# Patient Record
Sex: Male | Born: 1935 | Race: White | Hispanic: No | Marital: Married | Smoking: Former smoker
Health system: Southern US, Community
[De-identification: ages and names within clinical notes are randomized; demographics above are authoritative.]

## PROBLEM LIST (undated history)

## (undated) DIAGNOSIS — R471 Dysarthria and anarthria: Secondary | ICD-10-CM

## (undated) DIAGNOSIS — C801 Malignant (primary) neoplasm, unspecified: Secondary | ICD-10-CM

## (undated) DIAGNOSIS — Z8673 Personal history of transient ischemic attack (TIA), and cerebral infarction without residual deficits: Secondary | ICD-10-CM

## (undated) DIAGNOSIS — R251 Tremor, unspecified: Secondary | ICD-10-CM

## (undated) DIAGNOSIS — M1990 Unspecified osteoarthritis, unspecified site: Secondary | ICD-10-CM

## (undated) DIAGNOSIS — E039 Hypothyroidism, unspecified: Secondary | ICD-10-CM

## (undated) DIAGNOSIS — L409 Psoriasis, unspecified: Secondary | ICD-10-CM

## (undated) DIAGNOSIS — Z85828 Personal history of other malignant neoplasm of skin: Secondary | ICD-10-CM

## (undated) DIAGNOSIS — N390 Urinary tract infection, site not specified: Secondary | ICD-10-CM

## (undated) DIAGNOSIS — I10 Essential (primary) hypertension: Secondary | ICD-10-CM

## (undated) HISTORY — DX: Tremor, unspecified: R25.1

## (undated) HISTORY — PX: COLECTOMY: SHX59

## (undated) HISTORY — DX: Malignant (primary) neoplasm, unspecified: C80.1

## (undated) HISTORY — DX: Dysarthria and anarthria: R47.1

## (undated) HISTORY — DX: Psoriasis, unspecified: L40.9

## (undated) HISTORY — DX: Urinary tract infection, site not specified: N39.0

## (undated) HISTORY — DX: Unspecified osteoarthritis, unspecified site: M19.90

## (undated) HISTORY — DX: Essential (primary) hypertension: I10

## (undated) HISTORY — DX: Hypothyroidism, unspecified: E03.9

## (undated) HISTORY — DX: Personal history of other malignant neoplasm of skin: Z85.828

## (undated) HISTORY — DX: Personal history of transient ischemic attack (TIA), and cerebral infarction without residual deficits: Z86.73

---

## 2004-04-16 ENCOUNTER — Inpatient Hospital Stay (HOSPITAL_COMMUNITY): Admission: AD | Admit: 2004-04-16 | Discharge: 2004-04-20 | Payer: Self-pay | Admitting: Family Medicine

## 2004-04-19 ENCOUNTER — Encounter (INDEPENDENT_AMBULATORY_CARE_PROVIDER_SITE_OTHER): Payer: Self-pay | Admitting: *Deleted

## 2004-04-22 ENCOUNTER — Encounter: Admission: RE | Admit: 2004-04-22 | Discharge: 2004-04-22 | Payer: Self-pay | Admitting: Family Medicine

## 2004-07-16 ENCOUNTER — Inpatient Hospital Stay (HOSPITAL_COMMUNITY): Admission: RE | Admit: 2004-07-16 | Discharge: 2004-07-22 | Payer: Self-pay | Admitting: General Surgery

## 2004-07-16 ENCOUNTER — Encounter (INDEPENDENT_AMBULATORY_CARE_PROVIDER_SITE_OTHER): Payer: Self-pay | Admitting: *Deleted

## 2004-08-16 ENCOUNTER — Ambulatory Visit: Admission: RE | Admit: 2004-08-16 | Discharge: 2004-10-02 | Payer: Self-pay | Admitting: Radiation Oncology

## 2004-08-16 ENCOUNTER — Ambulatory Visit (HOSPITAL_COMMUNITY): Admission: RE | Admit: 2004-08-16 | Discharge: 2004-08-16 | Payer: Self-pay | Admitting: Hematology and Oncology

## 2004-10-04 ENCOUNTER — Ambulatory Visit: Payer: Self-pay | Admitting: Hematology and Oncology

## 2004-12-06 ENCOUNTER — Ambulatory Visit: Payer: Self-pay | Admitting: Hematology and Oncology

## 2005-02-21 ENCOUNTER — Ambulatory Visit (HOSPITAL_COMMUNITY): Admission: RE | Admit: 2005-02-21 | Discharge: 2005-02-21 | Payer: Self-pay | Admitting: Hematology and Oncology

## 2005-02-26 ENCOUNTER — Ambulatory Visit: Payer: Self-pay | Admitting: Hematology and Oncology

## 2005-06-10 ENCOUNTER — Ambulatory Visit: Payer: Self-pay | Admitting: Gastroenterology

## 2005-06-24 ENCOUNTER — Ambulatory Visit: Payer: Self-pay | Admitting: Internal Medicine

## 2005-06-25 ENCOUNTER — Ambulatory Visit: Payer: Self-pay | Admitting: Gastroenterology

## 2005-07-01 ENCOUNTER — Ambulatory Visit: Payer: Self-pay | Admitting: Internal Medicine

## 2005-07-01 ENCOUNTER — Ambulatory Visit: Payer: Self-pay | Admitting: Hematology and Oncology

## 2005-10-29 ENCOUNTER — Ambulatory Visit: Payer: Self-pay | Admitting: Hematology and Oncology

## 2006-01-01 ENCOUNTER — Ambulatory Visit: Payer: Self-pay | Admitting: Internal Medicine

## 2006-03-13 ENCOUNTER — Ambulatory Visit: Payer: Self-pay | Admitting: Hematology and Oncology

## 2006-03-13 LAB — COMPREHENSIVE METABOLIC PANEL
ALT: 14 U/L (ref 0–40)
Albumin: 4.5 g/dL (ref 3.5–5.2)
CO2: 30 mEq/L (ref 19–32)
Calcium: 9.4 mg/dL (ref 8.4–10.5)
Chloride: 102 mEq/L (ref 96–112)
Potassium: 4.3 mEq/L (ref 3.5–5.3)
Sodium: 140 mEq/L (ref 135–145)
Total Protein: 7.1 g/dL (ref 6.0–8.3)

## 2006-03-13 LAB — CBC WITH DIFFERENTIAL/PLATELET
Basophils Absolute: 0 10*3/uL (ref 0.0–0.1)
HCT: 41.3 % (ref 38.7–49.9)
HGB: 14.2 g/dL (ref 13.0–17.1)
LYMPH%: 24.5 % (ref 14.0–48.0)
MCH: 31.9 pg (ref 28.0–33.4)
MONO#: 0.8 10*3/uL (ref 0.1–0.9)
NEUT%: 63.3 % (ref 40.0–75.0)
Platelets: 319 10*3/uL (ref 145–400)
WBC: 8 10*3/uL (ref 4.0–10.0)
lymph#: 2 10*3/uL (ref 0.9–3.3)

## 2006-03-13 LAB — CEA: CEA: 1.4 ng/mL (ref 0.0–5.0)

## 2006-03-16 ENCOUNTER — Encounter: Admission: RE | Admit: 2006-03-16 | Discharge: 2006-03-16 | Payer: Self-pay | Admitting: Hematology and Oncology

## 2006-07-23 ENCOUNTER — Ambulatory Visit: Payer: Self-pay | Admitting: Internal Medicine

## 2006-07-28 ENCOUNTER — Ambulatory Visit: Payer: Self-pay | Admitting: Internal Medicine

## 2006-07-29 ENCOUNTER — Ambulatory Visit: Payer: Self-pay | Admitting: Hematology and Oncology

## 2006-07-30 LAB — CBC WITH DIFFERENTIAL/PLATELET
BASO%: 0.3 % (ref 0.0–2.0)
EOS%: 0.9 % (ref 0.0–7.0)
HCT: 41.3 % (ref 38.7–49.9)
LYMPH%: 19.1 % (ref 14.0–48.0)
MCH: 32 pg (ref 28.0–33.4)
MCHC: 34 g/dL (ref 32.0–35.9)
MCV: 94 fL (ref 81.6–98.0)
MONO#: 1.4 10*3/uL — ABNORMAL HIGH (ref 0.1–0.9)
MONO%: 15.1 % — ABNORMAL HIGH (ref 0.0–13.0)
NEUT%: 64.6 % (ref 40.0–75.0)
Platelets: 347 10*3/uL (ref 145–400)
RBC: 4.39 10*6/uL (ref 4.20–5.71)
WBC: 9.2 10*3/uL (ref 4.0–10.0)

## 2006-07-30 LAB — COMPREHENSIVE METABOLIC PANEL
ALT: 12 U/L (ref 0–40)
Alkaline Phosphatase: 55 U/L (ref 39–117)
CO2: 29 mEq/L (ref 19–32)
Creatinine, Ser: 0.97 mg/dL (ref 0.40–1.50)
Sodium: 142 mEq/L (ref 135–145)
Total Bilirubin: 0.5 mg/dL (ref 0.3–1.2)
Total Protein: 6.9 g/dL (ref 6.0–8.3)

## 2006-07-30 LAB — CEA: CEA: 1.1 ng/mL (ref 0.0–5.0)

## 2006-10-02 ENCOUNTER — Ambulatory Visit: Payer: Self-pay | Admitting: Internal Medicine

## 2007-01-01 ENCOUNTER — Ambulatory Visit: Payer: Self-pay | Admitting: Internal Medicine

## 2007-01-15 ENCOUNTER — Ambulatory Visit: Payer: Self-pay | Admitting: Internal Medicine

## 2007-01-26 ENCOUNTER — Ambulatory Visit: Payer: Self-pay | Admitting: Hematology and Oncology

## 2007-01-29 LAB — CBC WITH DIFFERENTIAL/PLATELET
BASO%: 0.3 % (ref 0.0–2.0)
EOS%: 1.5 % (ref 0.0–7.0)
MCH: 31.8 pg (ref 28.0–33.4)
MCHC: 34.7 g/dL (ref 32.0–35.9)
MONO%: 9.7 % (ref 0.0–13.0)
RDW: 13.2 % (ref 11.2–14.6)
lymph#: 2.1 10*3/uL (ref 0.9–3.3)

## 2007-01-29 LAB — COMPREHENSIVE METABOLIC PANEL
ALT: 12 U/L (ref 0–53)
AST: 17 U/L (ref 0–37)
Albumin: 4.7 g/dL (ref 3.5–5.2)
Alkaline Phosphatase: 56 U/L (ref 39–117)
Calcium: 9.5 mg/dL (ref 8.4–10.5)
Chloride: 100 mEq/L (ref 96–112)
Creatinine, Ser: 1.09 mg/dL (ref 0.40–1.50)
Potassium: 4.4 mEq/L (ref 3.5–5.3)

## 2007-02-02 ENCOUNTER — Encounter: Admission: RE | Admit: 2007-02-02 | Discharge: 2007-02-02 | Payer: Self-pay | Admitting: Hematology and Oncology

## 2007-05-20 ENCOUNTER — Ambulatory Visit: Payer: Self-pay | Admitting: Internal Medicine

## 2007-05-20 LAB — CONVERTED CEMR LAB
ALT: 16 units/L (ref 0–53)
Albumin: 3.9 g/dL (ref 3.5–5.2)
Alkaline Phosphatase: 53 units/L (ref 39–117)
BUN: 27 mg/dL — ABNORMAL HIGH (ref 6–23)
Basophils Absolute: 0.1 10*3/uL (ref 0.0–0.1)
Bilirubin, Direct: 0.1 mg/dL (ref 0.0–0.3)
Calcium: 9.5 mg/dL (ref 8.4–10.5)
Eosinophils Absolute: 0.2 10*3/uL (ref 0.0–0.6)
GFR calc Af Amer: 92 mL/min
GFR calc non Af Amer: 76 mL/min
HDL: 46 mg/dL (ref >39.0)
Lymphocytes Relative: 25.8 % (ref 12.0–46.0)
MCV: 93.8 fL (ref 78.0–100.0)
Monocytes Relative: 11.9 % — ABNORMAL HIGH (ref 3.0–11.0)
Neutro Abs: 6.1 10*3/uL (ref 1.4–7.7)
Platelets: 299 10*3/uL (ref 150–400)
Total CHOL/HDL Ratio: 2.9
Triglycerides: 114 mg/dL (ref 0–149)

## 2007-05-27 ENCOUNTER — Ambulatory Visit: Payer: Self-pay | Admitting: Internal Medicine

## 2007-08-02 ENCOUNTER — Ambulatory Visit: Payer: Self-pay | Admitting: Hematology and Oncology

## 2007-08-04 LAB — COMPREHENSIVE METABOLIC PANEL
AST: 15 U/L (ref 0–37)
Albumin: 4.4 g/dL (ref 3.5–5.2)
Alkaline Phosphatase: 46 U/L (ref 39–117)
Potassium: 4 mEq/L (ref 3.5–5.3)
Sodium: 141 mEq/L (ref 135–145)
Total Protein: 7.2 g/dL (ref 6.0–8.3)

## 2007-08-04 LAB — CBC WITH DIFFERENTIAL/PLATELET
Basophils Absolute: 0 10*3/uL (ref 0.0–0.1)
EOS%: 0.9 % (ref 0.0–7.0)
Eosinophils Absolute: 0.1 10*3/uL (ref 0.0–0.5)
HGB: 14.3 g/dL (ref 13.0–17.1)
LYMPH%: 20.8 % (ref 14.0–48.0)
MCH: 32.8 pg (ref 28.0–33.4)
MCV: 92.4 fL (ref 81.6–98.0)
MONO%: 10.4 % (ref 0.0–13.0)
NEUT%: 67.6 % (ref 40.0–75.0)
Platelets: 311 10*3/uL (ref 145–400)
RDW: 13.5 % (ref 11.2–14.6)

## 2007-11-25 DIAGNOSIS — Z85828 Personal history of other malignant neoplasm of skin: Secondary | ICD-10-CM

## 2007-11-25 HISTORY — DX: Personal history of other malignant neoplasm of skin: Z85.828

## 2007-11-29 ENCOUNTER — Ambulatory Visit: Payer: Self-pay | Admitting: Internal Medicine

## 2007-11-29 DIAGNOSIS — E7800 Pure hypercholesterolemia, unspecified: Secondary | ICD-10-CM | POA: Insufficient documentation

## 2007-12-01 LAB — CONVERTED CEMR LAB
Albumin: 3.7 g/dL (ref 3.5–5.2)
Alkaline Phosphatase: 46 units/L (ref 39–117)
BUN: 25 mg/dL — ABNORMAL HIGH (ref 6–23)
Basophils Relative: 0.9 % (ref 0.0–1.0)
Crystals: NEGATIVE
Eosinophils Absolute: 0.2 10*3/uL (ref 0.0–0.6)
GFR calc Af Amer: 75 mL/min
HDL: 41.8 mg/dL (ref >39.0)
Ketones, ur: NEGATIVE mg/dL
LDL Cholesterol: 60 mg/dL (ref 0–99)
Lymphocytes Relative: 18.6 % (ref 12.0–46.0)
Monocytes Relative: 16.6 % — ABNORMAL HIGH (ref 3.0–11.0)
Neutro Abs: 5.6 10*3/uL (ref 1.4–7.7)
PSA: 1.75 ng/mL (ref 0.10–4.00)
Platelets: 280 10*3/uL (ref 150–400)
Potassium: 4.4 meq/L (ref 3.5–5.1)
Specific Gravity, Urine: 1.01 (ref 1.000–1.03)
TSH: 1.43 microintl units/mL (ref 0.35–5.50)
Triglycerides: 146 mg/dL (ref 0–149)
Urine Glucose: NEGATIVE mg/dL
Urobilinogen, UA: 0.2 (ref 0.0–1.0)
VLDL: 29 mg/dL (ref 0–40)
pH: 7.5 (ref 5.0–8.0)

## 2007-12-03 ENCOUNTER — Ambulatory Visit: Payer: Self-pay | Admitting: Internal Medicine

## 2007-12-03 DIAGNOSIS — D485 Neoplasm of uncertain behavior of skin: Secondary | ICD-10-CM

## 2007-12-03 DIAGNOSIS — Z85038 Personal history of other malignant neoplasm of large intestine: Secondary | ICD-10-CM | POA: Insufficient documentation

## 2007-12-03 DIAGNOSIS — E039 Hypothyroidism, unspecified: Secondary | ICD-10-CM | POA: Insufficient documentation

## 2007-12-03 DIAGNOSIS — M1990 Unspecified osteoarthritis, unspecified site: Secondary | ICD-10-CM | POA: Insufficient documentation

## 2007-12-03 DIAGNOSIS — N419 Inflammatory disease of prostate, unspecified: Secondary | ICD-10-CM | POA: Insufficient documentation

## 2007-12-03 DIAGNOSIS — I10 Essential (primary) hypertension: Secondary | ICD-10-CM | POA: Insufficient documentation

## 2007-12-03 LAB — CONVERTED CEMR LAB
Bilirubin Urine: NEGATIVE
Ketones, ur: NEGATIVE mg/dL
Nitrite: NEGATIVE
Specific Gravity, Urine: 1.02 (ref 1.000–1.03)
pH: 6 (ref 5.0–8.0)

## 2007-12-06 ENCOUNTER — Encounter: Payer: Self-pay | Admitting: Internal Medicine

## 2007-12-07 ENCOUNTER — Telehealth: Payer: Self-pay | Admitting: Internal Medicine

## 2007-12-23 ENCOUNTER — Ambulatory Visit: Payer: Self-pay | Admitting: Internal Medicine

## 2008-01-12 ENCOUNTER — Encounter: Payer: Self-pay | Admitting: Internal Medicine

## 2008-01-13 ENCOUNTER — Encounter (INDEPENDENT_AMBULATORY_CARE_PROVIDER_SITE_OTHER): Payer: Self-pay | Admitting: *Deleted

## 2008-01-25 ENCOUNTER — Ambulatory Visit: Payer: Self-pay | Admitting: Hematology and Oncology

## 2008-01-27 LAB — CBC WITH DIFFERENTIAL/PLATELET
Eosinophils Absolute: 0.1 10*3/uL (ref 0.0–0.5)
HCT: 40.9 % (ref 38.7–49.9)
HGB: 14.5 g/dL (ref 13.0–17.1)
LYMPH%: 24.6 % (ref 14.0–48.0)
MONO#: 1 10*3/uL — ABNORMAL HIGH (ref 0.1–0.9)
NEUT#: 5.8 10*3/uL (ref 1.5–6.5)
Platelets: 322 10*3/uL (ref 145–400)
RBC: 4.44 10*6/uL (ref 4.20–5.71)
WBC: 9.1 10*3/uL (ref 4.0–10.0)

## 2008-01-27 LAB — COMPREHENSIVE METABOLIC PANEL
Albumin: 4.5 g/dL (ref 3.5–5.2)
CO2: 29 mEq/L (ref 19–32)
Glucose, Bld: 98 mg/dL (ref 70–99)
Sodium: 143 mEq/L (ref 135–145)
Total Bilirubin: 0.6 mg/dL (ref 0.3–1.2)
Total Protein: 7 g/dL (ref 6.0–8.3)

## 2008-01-28 ENCOUNTER — Encounter: Admission: RE | Admit: 2008-01-28 | Discharge: 2008-01-28 | Payer: Self-pay | Admitting: Hematology and Oncology

## 2008-01-28 ENCOUNTER — Encounter: Payer: Self-pay | Admitting: Internal Medicine

## 2008-02-08 ENCOUNTER — Encounter: Payer: Self-pay | Admitting: Internal Medicine

## 2008-02-10 ENCOUNTER — Encounter: Payer: Self-pay | Admitting: Internal Medicine

## 2008-03-07 ENCOUNTER — Ambulatory Visit: Payer: Self-pay | Admitting: Internal Medicine

## 2008-03-07 LAB — CONVERTED CEMR LAB
Bilirubin Urine: NEGATIVE
Ketones, ur: NEGATIVE mg/dL
Leukocytes, UA: NEGATIVE
Specific Gravity, Urine: 1.015 (ref 1.000–1.03)
Urobilinogen, UA: 0.2 (ref 0.0–1.0)

## 2008-03-13 ENCOUNTER — Ambulatory Visit: Payer: Self-pay | Admitting: Internal Medicine

## 2008-03-13 DIAGNOSIS — Z8679 Personal history of other diseases of the circulatory system: Secondary | ICD-10-CM | POA: Insufficient documentation

## 2008-03-13 DIAGNOSIS — Z85828 Personal history of other malignant neoplasm of skin: Secondary | ICD-10-CM

## 2008-03-13 DIAGNOSIS — R21 Rash and other nonspecific skin eruption: Secondary | ICD-10-CM

## 2008-05-01 ENCOUNTER — Encounter: Payer: Self-pay | Admitting: Internal Medicine

## 2008-06-20 ENCOUNTER — Telehealth: Payer: Self-pay | Admitting: Internal Medicine

## 2008-07-05 ENCOUNTER — Telehealth: Payer: Self-pay | Admitting: Internal Medicine

## 2008-07-17 ENCOUNTER — Ambulatory Visit: Payer: Self-pay | Admitting: Internal Medicine

## 2008-07-19 ENCOUNTER — Ambulatory Visit: Payer: Self-pay | Admitting: Internal Medicine

## 2008-07-19 LAB — CONVERTED CEMR LAB
Basophils Absolute: 0.1 10*3/uL (ref 0.0–0.1)
CO2: 32 meq/L (ref 19–32)
Chloride: 106 meq/L (ref 96–112)
Eosinophils Absolute: 0.2 10*3/uL (ref 0.0–0.7)
GFR calc non Af Amer: 56 mL/min
Lymphocytes Relative: 23.1 % (ref 12.0–46.0)
MCHC: 35 g/dL (ref 30.0–36.0)
MCV: 94.4 fL (ref 78.0–100.0)
Neutrophils Relative %: 63.6 % (ref 43.0–77.0)
Platelets: 335 10*3/uL (ref 150–400)
Potassium: 3.7 meq/L (ref 3.5–5.1)
RBC: 4.38 M/uL (ref 4.22–5.81)
RDW: 12.3 % (ref 11.5–14.6)
Sodium: 142 meq/L (ref 135–145)
TSH: 1.46 microintl units/mL (ref 0.35–5.50)

## 2008-07-21 ENCOUNTER — Encounter: Payer: Self-pay | Admitting: Internal Medicine

## 2008-07-21 LAB — CONVERTED CEMR LAB
Crystals: NEGATIVE
Ketones, ur: NEGATIVE mg/dL
Mucus, UA: NEGATIVE
Nitrite: NEGATIVE
Specific Gravity, Urine: 1.01 (ref 1.000–1.03)
Urobilinogen, UA: 1 (ref 0.0–1.0)
pH: 7 (ref 5.0–8.0)

## 2008-07-26 ENCOUNTER — Telehealth: Payer: Self-pay | Admitting: Internal Medicine

## 2008-07-28 ENCOUNTER — Ambulatory Visit: Payer: Self-pay | Admitting: Hematology and Oncology

## 2008-08-03 ENCOUNTER — Ambulatory Visit: Payer: Self-pay | Admitting: Hematology and Oncology

## 2008-08-03 ENCOUNTER — Ambulatory Visit (HOSPITAL_COMMUNITY): Admission: RE | Admit: 2008-08-03 | Discharge: 2008-08-03 | Payer: Self-pay | Admitting: Hematology and Oncology

## 2008-08-03 LAB — COMPREHENSIVE METABOLIC PANEL
Albumin: 4.2 g/dL (ref 3.5–5.2)
Alkaline Phosphatase: 49 U/L (ref 39–117)
BUN: 26 mg/dL — ABNORMAL HIGH (ref 6–23)
Creatinine, Ser: 1.3 mg/dL (ref 0.40–1.50)
Glucose, Bld: 104 mg/dL — ABNORMAL HIGH (ref 70–99)
Total Bilirubin: 0.6 mg/dL (ref 0.3–1.2)

## 2008-08-03 LAB — CBC WITH DIFFERENTIAL/PLATELET
Basophils Absolute: 0 10*3/uL (ref 0.0–0.1)
Eosinophils Absolute: 0.1 10*3/uL (ref 0.0–0.5)
HGB: 13.9 g/dL (ref 13.0–17.1)
LYMPH%: 21.5 % (ref 14.0–48.0)
MCV: 94.2 fL (ref 81.6–98.0)
MONO%: 11.3 % (ref 0.0–13.0)
NEUT#: 5.5 10*3/uL (ref 1.5–6.5)
NEUT%: 65.2 % (ref 40.0–75.0)
Platelets: 269 10*3/uL (ref 145–400)

## 2008-08-10 ENCOUNTER — Encounter: Payer: Self-pay | Admitting: Internal Medicine

## 2008-08-11 ENCOUNTER — Telehealth: Payer: Self-pay | Admitting: Internal Medicine

## 2008-08-29 ENCOUNTER — Ambulatory Visit: Payer: Self-pay | Admitting: Internal Medicine

## 2008-08-31 ENCOUNTER — Ambulatory Visit: Payer: Self-pay | Admitting: Internal Medicine

## 2008-08-31 LAB — CONVERTED CEMR LAB
Hemoglobin, Urine: NEGATIVE
Ketones, ur: NEGATIVE mg/dL
Leukocytes, UA: NEGATIVE
Specific Gravity, Urine: <=1.005 (ref 1.000–1.03)
Urine Glucose: NEGATIVE mg/dL
Urobilinogen, UA: 1 (ref 0.0–1.0)

## 2008-11-28 ENCOUNTER — Ambulatory Visit: Payer: Self-pay | Admitting: Internal Medicine

## 2008-11-30 LAB — CONVERTED CEMR LAB
BUN: 28 mg/dL — ABNORMAL HIGH (ref 6–23)
Bilirubin Urine: NEGATIVE
CO2: 32 meq/L (ref 19–32)
Calcium: 9.3 mg/dL (ref 8.4–10.5)
Creatinine, Ser: 1.2 mg/dL (ref 0.4–1.5)
Crystals: NEGATIVE
Glucose, Bld: 101 mg/dL — ABNORMAL HIGH (ref 70–99)
Sodium: 142 meq/L (ref 135–145)
Total Protein, Urine: NEGATIVE mg/dL
Urine Glucose: NEGATIVE mg/dL
Urobilinogen, UA: 0.2 (ref 0.0–1.0)

## 2008-12-01 ENCOUNTER — Ambulatory Visit: Payer: Self-pay | Admitting: Internal Medicine

## 2009-01-05 IMAGING — CT CT ABDOMEN W/ CM
4 of 5 series · 12 of 46 positions shown, 18 images · IV contrast (READICAT/WATER & [ID] OMNI 300)
Comparison: 03/16/06.
COMPARISON: 03/16/06.
COMPARISON: 03/16/06.

CLINICAL DATA: Colon CA.  Surgery.  Oral chemotherapy.
CHEST CT WITH CONTRAST:
TECHNIQUE: Multidetector CT imaging of the chest was performed following the standard protocol during bolus administration of intravenous contrast.
Contrast:  100 cc of Omnipaque 300.
TECHNIQUE: Multidetector CT imaging of the abdomen was performed following the standard protocol during bolus administration of intravenous contrast.
TECHNIQUE: Multidetector CT imaging of the pelvis was performed following the standard protocol during bolus administration of intravenous contrast.

[Series 3: chest/abd/pelvis · axial · 0.77mm/px · z∈[-453,-203]mm · 5 of 114 slices shown]
[im 13/114  soft-tissue]
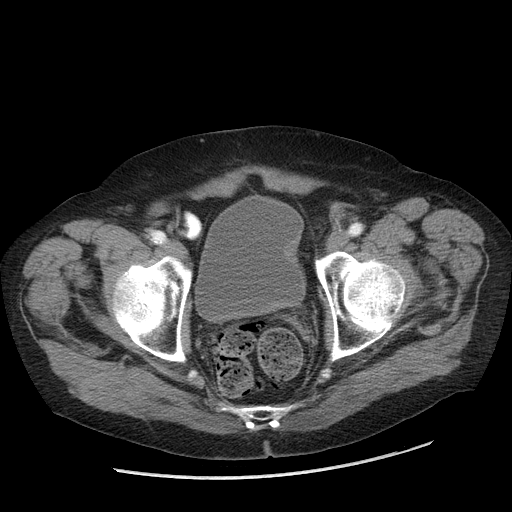
[im 26/114  soft-tissue]
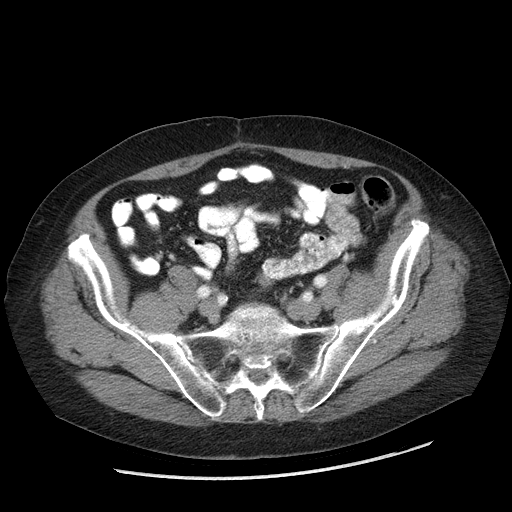
[im 38/114  soft-tissue]
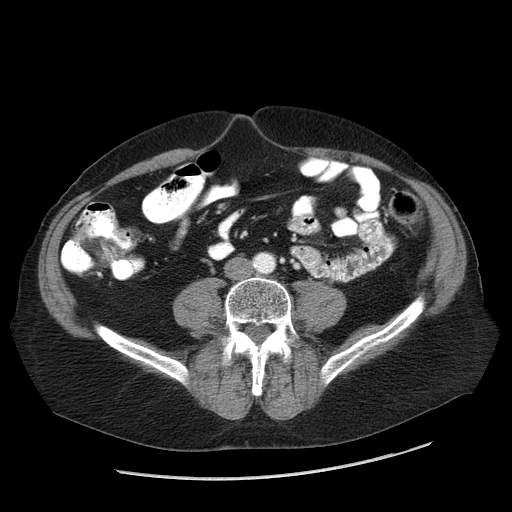
[im 51/114  soft-tissue]
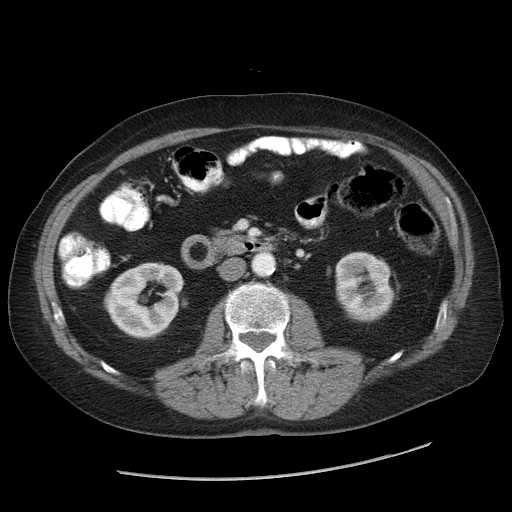
[im 63/114  soft-tissue]
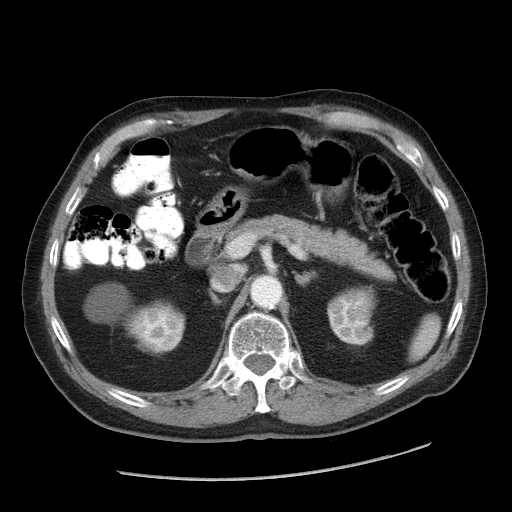

[Series 5: renal delay · axial · delayed · 0.77mm/px · z∈[-298,-228]mm · 3 of 30 slices shown, 7 images]
[im 8/30  soft-tissue]
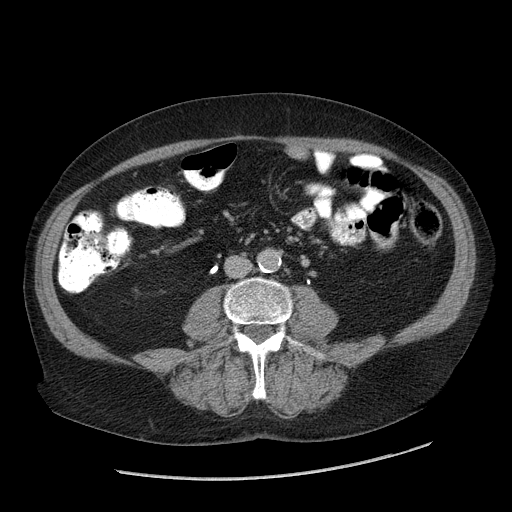
[im 8/30  lung]
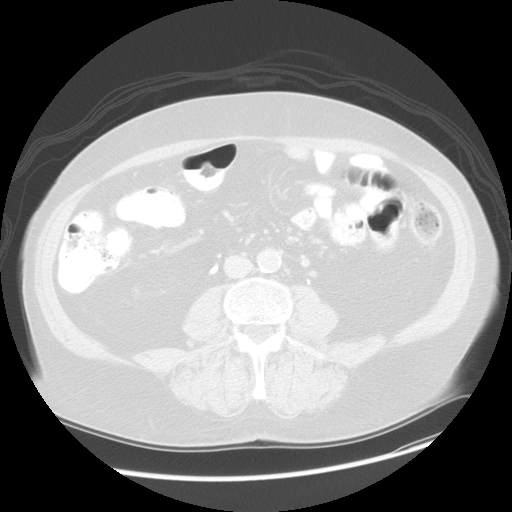
[im 8/30  bone]
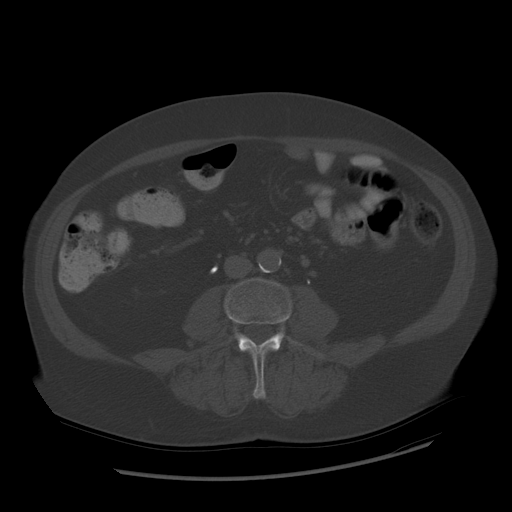
[im 15/30  soft-tissue]
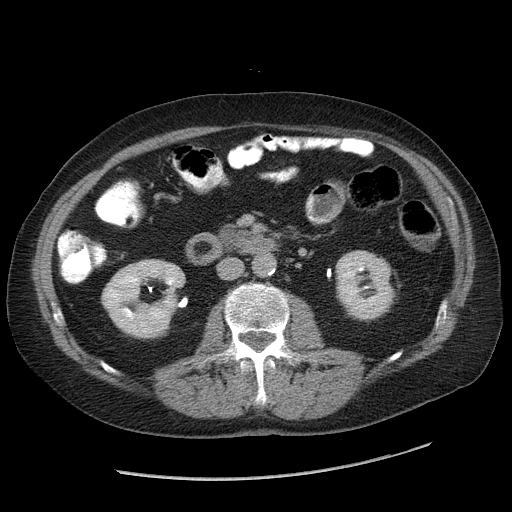
[im 15/30  lung]
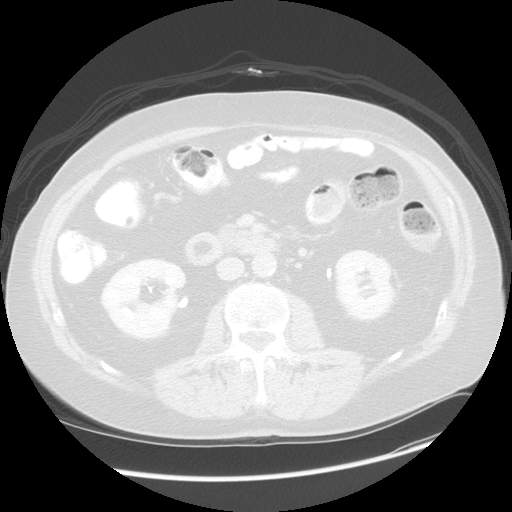
[im 22/30  soft-tissue]
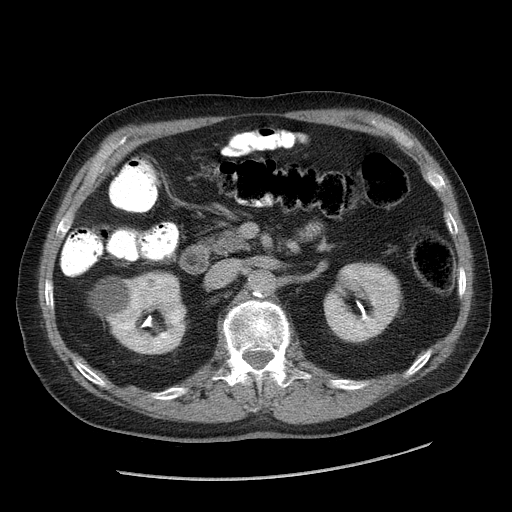
[im 22/30  lung]
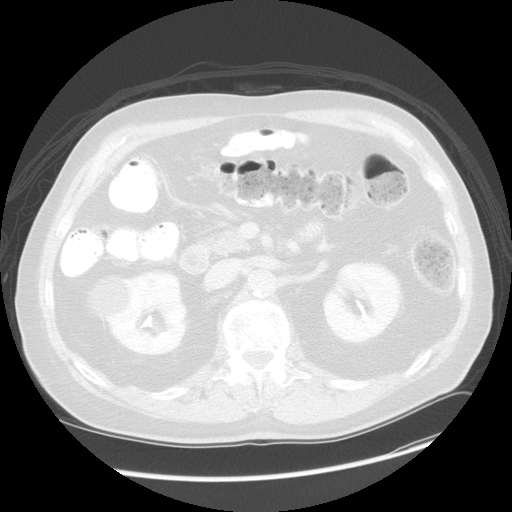

[Series 601: coronal body · coronal · 1.21mm/px · 1 of 138 slices shown, 2 images]
[im 46/138  soft-tissue]
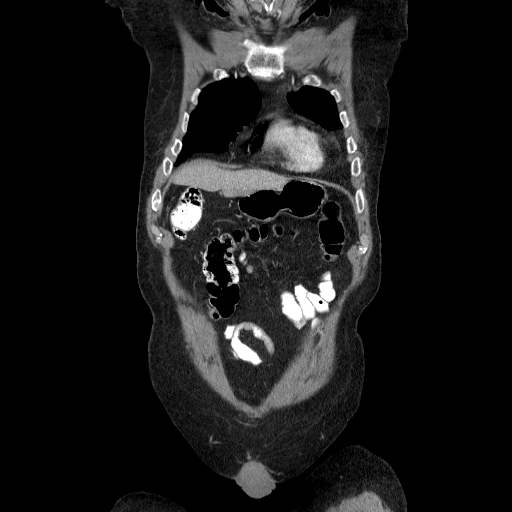
[im 46/138  bone]
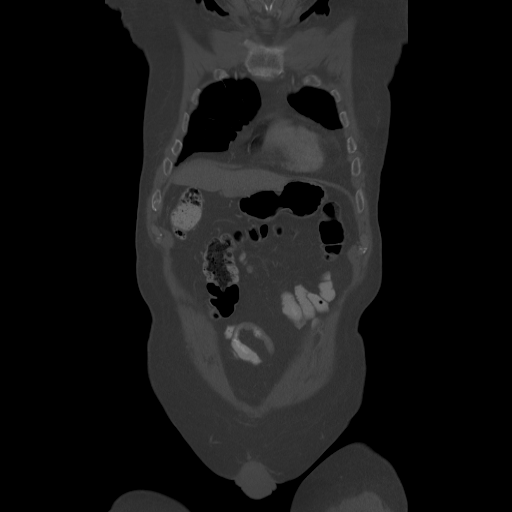

[Series 602: sagittal body · sagittal · 1.21mm/px · 3 of 158 slices shown, 4 images]
[im 53/158  soft-tissue]
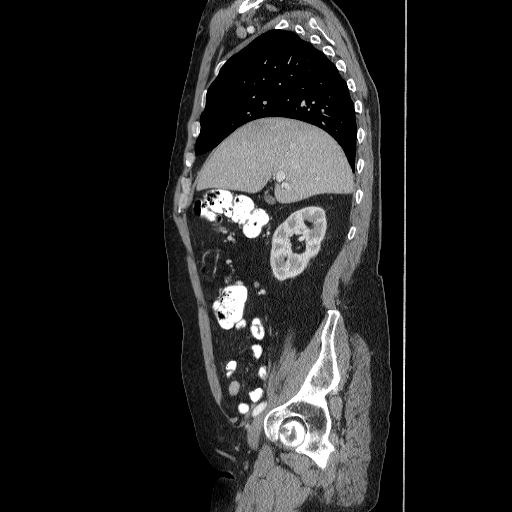
[im 70/158  soft-tissue]
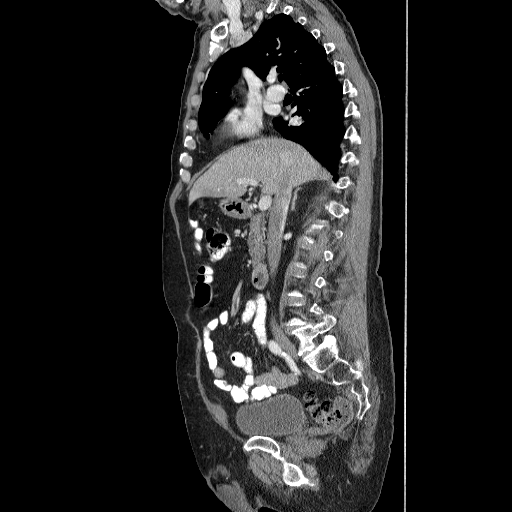
[im 70/158  bone]
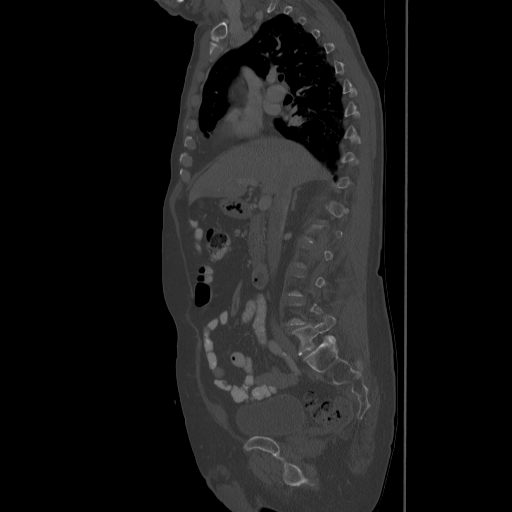
[im 88/158  soft-tissue]
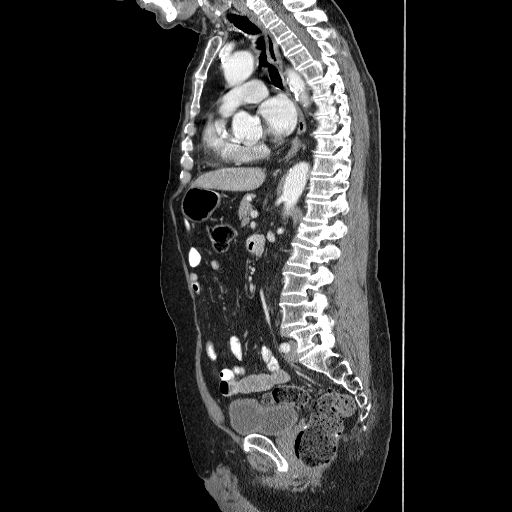

[12 of 46 positions shown; findings below may reference images not displayed]

FINDINGS: Negative for metastatic involvement of the mediastinum, hila, pleura, or pulmonary parenchyma.  1.6 cm lung cyst or bulla is again noted in the left lower lobe.  Minute nodular-like density in the right upper lobe (see image 25 series 4) is unchanged.  Calcific density in the coronary arteries.  Slightly prominent size of mediastinal nodes are stable.  No evidence for metastatic involvement of the bony structures.  L1 compression fracture with anterior wedging.
IMPRESSION: Negative for metastatic involvement of the chest.
ABDOMEN CT WITH CONTRAST:
FINDINGS: Calcified granuloma in the left hepatic lobe. Negative for metastatic involvement of the liver, spleen, or adrenal glands.   Stable appearance of minute cysts posterior segment of right hepatic lobe (see image 45 of series 3).  Again appreciated is 3.4 x 4.3 cm cyst arising from the lateral aspect of the upper pole of the right kidney and 7-8 mm cyst in the right kidney as well.  Small bilateral renal parapelvic cysts.  Stable appearance of lipoma in the descending duodenum, measuring 1.4 x 1.5 x 4.3 cm.  No pathologically enlarged lymph nodes.  L1 compression fracture.  Grade I anterolisthesis of L4 on L5.
IMPRESSION: Negative for metastatic involvement of the abdomen.  Stable scan when compared to 03/16/06.
PELVIS CT WITH CONTRAST:
FINDINGS: Prostate gland is enlarged measuring approximately 5.5 cm in maximum transverse dimension.  Focal thickening of the left lateral wall of the bladder with suspicion for a focal polypoid mass measuring approximately 1.1 x 1.9 cm (see transaxial image 102, series 3).  This is not appreciated on the prior exam.  Transitional cell carcinoma of the bladder is a consideration.  No pathologically enlarged pelvic lymph nodes.  Sigmoid colon diverticula.  Bony pelvis intact. Marked stenosis of the central canal and lateral recesses at L4-5, the site of grade I spondylolisthesis of L4 on L5.
IMPRESSION: Negative for metastatic involvement of the pelvis.  Suspicion for bladder tumor.  See comments above.

## 2009-01-30 ENCOUNTER — Ambulatory Visit: Payer: Self-pay | Admitting: Hematology and Oncology

## 2009-02-01 ENCOUNTER — Encounter: Payer: Self-pay | Admitting: Internal Medicine

## 2009-02-01 LAB — COMPREHENSIVE METABOLIC PANEL
AST: 14 U/L (ref 0–37)
Alkaline Phosphatase: 44 U/L (ref 39–117)
BUN: 32 mg/dL — ABNORMAL HIGH (ref 6–23)
Glucose, Bld: 100 mg/dL — ABNORMAL HIGH (ref 70–99)
Sodium: 141 mEq/L (ref 135–145)
Total Bilirubin: 0.5 mg/dL (ref 0.3–1.2)
Total Protein: 7.1 g/dL (ref 6.0–8.3)

## 2009-02-01 LAB — CBC WITH DIFFERENTIAL/PLATELET
Basophils Absolute: 0 10*3/uL (ref 0.0–0.1)
EOS%: 1.6 % (ref 0.0–7.0)
LYMPH%: 22.7 % (ref 14.0–49.0)
MCH: 32.4 pg (ref 27.2–33.4)
MCV: 94.6 fL (ref 79.3–98.0)
MONO%: 11.2 % (ref 0.0–14.0)
Platelets: 276 10*3/uL (ref 140–400)
RBC: 4.34 10*6/uL (ref 4.20–5.82)
RDW: 13.5 % (ref 11.0–14.6)

## 2009-02-01 LAB — CEA: CEA: 0.9 ng/mL (ref 0.0–5.0)

## 2009-02-02 ENCOUNTER — Ambulatory Visit (HOSPITAL_COMMUNITY): Admission: RE | Admit: 2009-02-02 | Discharge: 2009-02-02 | Payer: Self-pay | Admitting: Hematology and Oncology

## 2009-02-07 ENCOUNTER — Encounter: Payer: Self-pay | Admitting: Gastroenterology

## 2009-02-19 ENCOUNTER — Telehealth: Payer: Self-pay | Admitting: Internal Medicine

## 2009-02-19 DIAGNOSIS — N289 Disorder of kidney and ureter, unspecified: Secondary | ICD-10-CM | POA: Insufficient documentation

## 2009-02-26 ENCOUNTER — Encounter: Admission: RE | Admit: 2009-02-26 | Discharge: 2009-02-26 | Payer: Self-pay | Admitting: Internal Medicine

## 2009-03-01 ENCOUNTER — Ambulatory Visit: Payer: Self-pay | Admitting: Internal Medicine

## 2009-03-08 ENCOUNTER — Ambulatory Visit: Payer: Self-pay | Admitting: Internal Medicine

## 2009-03-08 DIAGNOSIS — N281 Cyst of kidney, acquired: Secondary | ICD-10-CM

## 2009-04-06 ENCOUNTER — Telehealth: Payer: Self-pay | Admitting: Internal Medicine

## 2009-06-11 ENCOUNTER — Ambulatory Visit: Payer: Self-pay | Admitting: Internal Medicine

## 2009-06-11 LAB — CONVERTED CEMR LAB
BUN: 21 mg/dL (ref 6–23)
Chloride: 102 meq/L (ref 96–112)
GFR calc non Af Amer: 67.77 mL/min (ref >60)
Glucose, Bld: 97 mg/dL (ref 70–99)
Hgb A1c MFr Bld: 5.6 % (ref 4.6–6.5)
Ketones, ur: NEGATIVE mg/dL
Potassium: 3.5 meq/L (ref 3.5–5.1)
Specific Gravity, Urine: 1.01 (ref 1.000–1.030)
Urine Glucose: NEGATIVE mg/dL
pH: 7 (ref 5.0–8.0)

## 2009-06-15 ENCOUNTER — Ambulatory Visit: Payer: Self-pay | Admitting: Internal Medicine

## 2009-08-06 ENCOUNTER — Ambulatory Visit: Payer: Self-pay | Admitting: Hematology and Oncology

## 2009-08-08 ENCOUNTER — Ambulatory Visit (HOSPITAL_COMMUNITY): Admission: RE | Admit: 2009-08-08 | Discharge: 2009-08-08 | Payer: Self-pay | Admitting: Hematology and Oncology

## 2009-08-08 LAB — COMPREHENSIVE METABOLIC PANEL
ALT: 12 U/L (ref 0–53)
AST: 15 U/L (ref 0–37)
Albumin: 4.3 g/dL (ref 3.5–5.2)
Alkaline Phosphatase: 50 U/L (ref 39–117)
Calcium: 9.5 mg/dL (ref 8.4–10.5)
Chloride: 104 mEq/L (ref 96–112)
Potassium: 3.9 mEq/L (ref 3.5–5.3)
Sodium: 140 mEq/L (ref 135–145)
Total Protein: 6.9 g/dL (ref 6.0–8.3)

## 2009-08-08 LAB — CBC WITH DIFFERENTIAL/PLATELET
BASO%: 0.5 % (ref 0.0–2.0)
Basophils Absolute: 0 10*3/uL (ref 0.0–0.1)
EOS%: 1.4 % (ref 0.0–7.0)
HGB: 13.7 g/dL (ref 13.0–17.1)
MCH: 32.9 pg (ref 27.2–33.4)
MCHC: 34.6 g/dL (ref 32.0–36.0)
RBC: 4.15 10*6/uL — ABNORMAL LOW (ref 4.20–5.82)
RDW: 13.6 % (ref 11.0–14.6)
lymph#: 2.5 10*3/uL (ref 0.9–3.3)

## 2009-08-10 ENCOUNTER — Encounter: Payer: Self-pay | Admitting: Internal Medicine

## 2009-10-09 ENCOUNTER — Ambulatory Visit: Payer: Self-pay | Admitting: Internal Medicine

## 2009-10-09 LAB — CONVERTED CEMR LAB
CO2: 32 meq/L (ref 19–32)
Chloride: 100 meq/L (ref 96–112)
Sodium: 142 meq/L (ref 135–145)
Specific Gravity, Urine: <=1.005 (ref 1.000–1.030)
Total Protein, Urine: NEGATIVE mg/dL
Urine Glucose: NEGATIVE mg/dL

## 2009-10-12 ENCOUNTER — Ambulatory Visit: Payer: Self-pay | Admitting: Internal Medicine

## 2009-10-12 DIAGNOSIS — Z87891 Personal history of nicotine dependence: Secondary | ICD-10-CM

## 2010-01-31 ENCOUNTER — Ambulatory Visit: Payer: Self-pay | Admitting: Hematology and Oncology

## 2010-02-04 LAB — COMPREHENSIVE METABOLIC PANEL
Albumin: 4.4 g/dL (ref 3.5–5.2)
BUN: 37 mg/dL — ABNORMAL HIGH (ref 6–23)
CO2: 27 mEq/L (ref 19–32)
Glucose, Bld: 100 mg/dL — ABNORMAL HIGH (ref 70–99)
Potassium: 3.7 mEq/L (ref 3.5–5.3)
Sodium: 134 mEq/L — ABNORMAL LOW (ref 135–145)
Total Bilirubin: 0.5 mg/dL (ref 0.3–1.2)
Total Protein: 7.1 g/dL (ref 6.0–8.3)

## 2010-02-04 LAB — CBC WITH DIFFERENTIAL/PLATELET
Basophils Absolute: 0 10*3/uL (ref 0.0–0.1)
Eosinophils Absolute: 0.2 10*3/uL (ref 0.0–0.5)
HGB: 14.1 g/dL (ref 13.0–17.1)
LYMPH%: 16.7 % (ref 14.0–49.0)
MCV: 95.6 fL (ref 79.3–98.0)
MONO#: 1.5 10*3/uL — ABNORMAL HIGH (ref 0.1–0.9)
NEUT#: 11.2 10*3/uL — ABNORMAL HIGH (ref 1.5–6.5)
Platelets: 307 10*3/uL (ref 140–400)
RBC: 4.26 10*6/uL (ref 4.20–5.82)
WBC: 15.4 10*3/uL — ABNORMAL HIGH (ref 4.0–10.3)

## 2010-02-04 LAB — CEA: CEA: 1.3 ng/mL (ref 0.0–5.0)

## 2010-02-12 ENCOUNTER — Encounter: Payer: Self-pay | Admitting: Internal Medicine

## 2010-04-08 ENCOUNTER — Ambulatory Visit: Payer: Self-pay | Admitting: Internal Medicine

## 2010-04-09 LAB — CONVERTED CEMR LAB
AST: 19 units/L (ref 0–37)
Albumin: 4 g/dL (ref 3.5–5.2)
Alkaline Phosphatase: 51 units/L (ref 39–117)
BUN: 25 mg/dL — ABNORMAL HIGH (ref 6–23)
Basophils Relative: 0.3 % (ref 0.0–3.0)
Bilirubin Urine: NEGATIVE
CO2: 30 meq/L (ref 19–32)
Creatinine, Ser: 1.1 mg/dL (ref 0.4–1.5)
Eosinophils Relative: 2.2 % (ref 0.0–5.0)
GFR calc non Af Amer: 66.24 mL/min (ref >60)
GFR calc non Af Amer: 67.81 mL/min (ref >60)
Glucose, Bld: 88 mg/dL (ref 70–99)
Glucose, Bld: 98 mg/dL (ref 70–99)
Ketones, ur: NEGATIVE mg/dL
Lymphocytes Relative: 22.7 % (ref 12.0–46.0)
MCV: 95.7 fL (ref 78.0–100.0)
Monocytes Absolute: 1 10*3/uL (ref 0.1–1.0)
Monocytes Relative: 9.6 % (ref 3.0–12.0)
Neutrophils Relative %: 65.2 % (ref 43.0–77.0)
Nitrite: NEGATIVE
PSA: 2.84 ng/mL (ref 0.10–4.00)
Potassium: 3.8 meq/L (ref 3.5–5.1)
Potassium: 3.8 meq/L (ref 3.5–5.1)
Pro B Natriuretic peptide (BNP): 29.5 pg/mL (ref 0.0–100.0)
RBC: 4.16 M/uL — ABNORMAL LOW (ref 4.22–5.81)
Sodium: 139 meq/L (ref 135–145)
Specific Gravity, Urine: 1.01 (ref 1.000–1.030)
Specific Gravity, Urine: 1.01 (ref 1.000–1.030)
Urine Glucose: NEGATIVE mg/dL
Urobilinogen, UA: 0.2 (ref 0.0–1.0)
Urobilinogen, UA: 1 (ref 0.0–1.0)
VLDL: 20.4 mg/dL (ref 0.0–40.0)
WBC: 10.7 10*3/uL — ABNORMAL HIGH (ref 4.5–10.5)

## 2010-04-12 ENCOUNTER — Ambulatory Visit: Payer: Self-pay | Admitting: Internal Medicine

## 2010-04-12 DIAGNOSIS — N390 Urinary tract infection, site not specified: Secondary | ICD-10-CM

## 2010-10-07 ENCOUNTER — Ambulatory Visit: Payer: Self-pay | Admitting: Internal Medicine

## 2010-10-08 LAB — CONVERTED CEMR LAB
ALT: 11 units/L (ref 0–53)
Albumin: 3.9 g/dL (ref 3.5–5.2)
Basophils Absolute: 0.1 10*3/uL (ref 0.0–0.1)
CO2: 29 meq/L (ref 19–32)
Calcium: 9.1 mg/dL (ref 8.4–10.5)
Chloride: 100 meq/L (ref 96–112)
Creatinine, Ser: 1.2 mg/dL (ref 0.4–1.5)
Eosinophils Absolute: 0.2 10*3/uL (ref 0.0–0.7)
Glucose, Bld: 99 mg/dL (ref 70–99)
Hemoglobin: 13.4 g/dL (ref 13.0–17.0)
Lymphocytes Relative: 29.6 % (ref 12.0–46.0)
Monocytes Relative: 10 % (ref 3.0–12.0)
Neutro Abs: 6.1 10*3/uL (ref 1.4–7.7)
Neutrophils Relative %: 57.7 % (ref 43.0–77.0)
Nitrite: POSITIVE
RBC: 3.97 M/uL — ABNORMAL LOW (ref 4.22–5.81)
RDW: 13.3 % (ref 11.5–14.6)
Specific Gravity, Urine: 1.015 (ref 1.000–1.030)
Total Protein: 6.5 g/dL (ref 6.0–8.3)
Urine Glucose: NEGATIVE mg/dL
Urobilinogen, UA: 0.2 (ref 0.0–1.0)

## 2010-10-11 ENCOUNTER — Ambulatory Visit: Payer: Self-pay | Admitting: Internal Medicine

## 2010-10-30 ENCOUNTER — Ambulatory Visit: Payer: Self-pay | Admitting: Hematology and Oncology

## 2010-11-01 LAB — COMPREHENSIVE METABOLIC PANEL
ALT: 14 U/L (ref 0–53)
Albumin: 3.9 g/dL (ref 3.5–5.2)
CO2: 28 mEq/L (ref 19–32)
Calcium: 9.4 mg/dL (ref 8.4–10.5)
Chloride: 102 mEq/L (ref 96–112)
Potassium: 4.3 mEq/L (ref 3.5–5.3)
Sodium: 141 mEq/L (ref 135–145)
Total Bilirubin: 0.7 mg/dL (ref 0.3–1.2)
Total Protein: 7.5 g/dL (ref 6.0–8.3)

## 2010-11-01 LAB — CBC WITH DIFFERENTIAL/PLATELET
BASO%: 0.4 % (ref 0.0–2.0)
MCHC: 34.8 g/dL (ref 32.0–36.0)
MONO#: 1.1 10*3/uL — ABNORMAL HIGH (ref 0.1–0.9)
NEUT#: 5.1 10*3/uL (ref 1.5–6.5)
RBC: 4.2 10*6/uL (ref 4.20–5.82)
RDW: 13.5 % (ref 11.0–14.6)
WBC: 8.8 10*3/uL (ref 4.0–10.3)
lymph#: 2.4 10*3/uL (ref 0.9–3.3)

## 2010-11-02 LAB — CEA: CEA: 1 ng/mL (ref 0.0–5.0)

## 2010-11-04 ENCOUNTER — Ambulatory Visit (HOSPITAL_COMMUNITY)
Admission: RE | Admit: 2010-11-04 | Discharge: 2010-11-04 | Payer: Self-pay | Source: Home / Self Care | Attending: Hematology and Oncology | Admitting: Hematology and Oncology

## 2010-11-06 ENCOUNTER — Encounter: Payer: Self-pay | Admitting: Internal Medicine

## 2010-12-15 ENCOUNTER — Encounter: Payer: Self-pay | Admitting: Hematology and Oncology

## 2010-12-26 NOTE — Assessment & Plan Note (Signed)
Summary: 6 MTH FU  STC   Vital Signs:  Patient profile:   77 year old male Height:      68 inches Weight:      178 pounds BMI:     27.16 Temp:     97.5 degrees F oral Pulse rate:   72 / minute Pulse rhythm:   regular Resp:     16 per minute BP sitting:   140 / 74  (left arm) Cuff size:   regular  Vitals Entered By: Lanier Prude, CMA(AAMA) (October 11, 2010 11:03 AM) CC: 6 mo f/u Is Patient Diabetic? No   CC:  6 mo f/u.  History of Present Illness: The patient presents for a follow up of hypertension, UTIs, hyperlipidemia, OA   Current Medications (verified): 1)  Norvasc 10 Mg Tabs (Amlodipine Besylate) .Marland Kitchen.. 1 By Mouth Qd 2)  Levothroid 100 Mcg Tabs (Levothyroxine Sodium) .Marland Kitchen.. 1 By Mouth Qd 3)  Crestor 10 Mg Tabs (Rosuvastatin Calcium) .... Take 1 Tab By Mouth Daily 4)  Aspir-Low 81 Mg Tbec (Aspirin) .Marland Kitchen.. 1 Once Daily After Meal 5)  Atenolol-Chlorthalidone 50-25 Mg Tabs (Atenolol-Chlorthalidone) .Marland Kitchen.. 1 By Mouth Every Day 6)  Tramadol Hcl 50 Mg  Tabs (Tramadol Hcl) .Marland Kitchen.. 1or2 Two Times A Day  Prn 7)  Flomax 0.4 Mg Cp24 (Tamsulosin Hcl) .... Take 1 Capsule By Mouth Daily 8)  Vitamin D3 1000 Unit  Tabs (Cholecalciferol) .Marland Kitchen.. 1 Qd 9)  Pennsaid 1.5 % Soln (Diclofenac Sodium) .... 3-5 Gtt To Skin Three Times Daily 10)  Cipro 500 Mg Tabs (Ciprofloxacin Hcl) .Marland Kitchen.. 1 By Mouth Two Times A Day  Allergies (verified): No Known Drug Allergies  Past History:  Past Surgical History: Last updated: 12/01/2008 Colectomy  Social History: Last updated: 12/03/2007 Retired Married Former Smoker  Past Medical History: Colon cancer, hx of   2005  Dr Dalene Carrow  Dr Arlyce Dice Hypertension Osteoarthritis Dysarthria Hypothyroidism Skin cancer, hx of 2009 UTI 2009, 2011 Cerebrovascular accident, hx of Psoriasis Tremor  Physical Exam  General:  NAD Nose:  WNL Mouth:  WNL Lungs:  Normal respiratory effort, chest expands symmetrically. Lungs are clear to auscultation, no crackles or  wheezes. Heart:  Normal rate and regular rhythm. S1 and S2 normal without gallop, murmur, click, rub or other extra sounds. Abdomen:  Bowel sounds positive,abdomen soft and non-tender without masses, organomegaly or hernias noted. Msk:  No deformity or scoliosis noted of thoracic or lumbar spine.  B knees w/OA deformities, tender Using a 4 prong cane  L knee is tender w/ROM Extremities:  no edema B Neurologic:  Dysarthric, aphasic Lower lip w/tremor L hand w/mild tremor Skin:  Aging changes 11 mm L temple growth an 6 mm nest to L ear   Impression & Recommendations:  Problem # 1:  UTI (ICD-599.0) Assessment New Options discussed; we will get UA q 3 months  [place] w/o obstruction in 2010 His updated medication list for this problem includes:    Cipro 500 Mg Tabs (Ciprofloxacin hcl) .Marland Kitchen... 1 by mouth two times a day  Problem # 2:  RENAL INSUFFICIENCY (ICD-593.9) Assessment: Unchanged The labs were reviewed with the patient.   Problem # 3:  CEREBROVASCULAR ACCIDENT, HX OF (ICD-V12.50) Assessment: Unchanged  Problem # 4:  HYPOTHYROIDISM (ICD-244.9) Assessment: Unchanged  His updated medication list for this problem includes:    Levothroid 100 Mcg Tabs (Levothyroxine sodium) .Marland Kitchen... 1 by mouth qd  Problem # 5:  HYPERTENSION (ICD-401.9) Assessment: Unchanged  His updated medication list for this problem  includes:    Norvasc 10 Mg Tabs (Amlodipine besylate) .Marland Kitchen... 1 by mouth qd    Atenolol-chlorthalidone 50-25 Mg Tabs (Atenolol-chlorthalidone) .Marland Kitchen... 1 by mouth every day  BP today: 140/74 Prior BP: 142/80 (04/12/2010)  Labs Reviewed: K+: 3.7 (10/07/2010) Creat: : 1.2 (10/07/2010)   Chol: 127 (04/08/2010)   HDL: 44.50 (04/08/2010)   LDL: 62 (04/08/2010)   TG: 102.0 (04/08/2010)  Problem # 6:  NEOPLASM OF UNCERTAIN BEHAVIOR OF SKIN (ICD-238.2) face Sch biopsy   Complete Medication List: 1)  Norvasc 10 Mg Tabs (Amlodipine besylate) .Marland Kitchen.. 1 by mouth qd 2)  Levothroid 100 Mcg Tabs  (Levothyroxine sodium) .Marland Kitchen.. 1 by mouth qd 3)  Crestor 10 Mg Tabs (Rosuvastatin calcium) .... Take 1 tab by mouth daily 4)  Aspir-low 81 Mg Tbec (Aspirin) .Marland Kitchen.. 1 once daily after meal 5)  Atenolol-chlorthalidone 50-25 Mg Tabs (Atenolol-chlorthalidone) .Marland Kitchen.. 1 by mouth every day 6)  Tramadol Hcl 50 Mg Tabs (Tramadol hcl) .Marland Kitchen.. 1or2 two times a day  prn 7)  Flomax 0.4 Mg Cp24 (Tamsulosin hcl) .... Take 1 capsule by mouth daily 8)  Vitamin D3 1000 Unit Tabs (Cholecalciferol) .Marland Kitchen.. 1 qd 9)  Pennsaid 1.5 % Soln (Diclofenac sodium) .... 3-5 gtt to skin three times daily 10)  Cipro 500 Mg Tabs (Ciprofloxacin hcl) .Marland Kitchen.. 1 by mouth two times a day  Patient Instructions: 1)  Please schedule a follow-up appointment in 3 months skin bx . 2)  BMP prior to visit, ICD-9:401.1 3)  Urine-dip prior to visit, ICD-9:595.0 Prescriptions: FLOMAX 0.4 MG CP24 (TAMSULOSIN HCL) Take 1 capsule by mouth daily  #90 x 3   Entered and Authorized by:   Tresa Garter MD   Signed by:   Tresa Garter MD on 10/11/2010   Method used:   Print then Give to Patient   RxID:   [id number] ATENOLOL-CHLORTHALIDONE 50-25 MG TABS (ATENOLOL-CHLORTHALIDONE) 1 by mouth every day  #90 x 3   Entered and Authorized by:   Tresa Garter MD   Signed by:   Tresa Garter MD on 10/11/2010   Method used:   Print then Give to Patient   RxID:   [id number] CRESTOR 10 MG TABS (ROSUVASTATIN CALCIUM) Take 1 tab by mouth daily  #90 x 3   Entered and Authorized by:   Tresa Garter MD   Signed by:   Tresa Garter MD on 10/11/2010   Method used:   Print then Give to Patient   RxID:   [id number] LEVOTHROID 100 MCG TABS (LEVOTHYROXINE SODIUM) 1 by mouth qd  #90 x 3   Entered and Authorized by:   Tresa Garter MD   Signed by:   Tresa Garter MD on 10/11/2010   Method used:   Print then Give to Patient   RxID:   [id number] NORVASC 10 MG TABS (AMLODIPINE BESYLATE) 1 by mouth qd  #90 x 3    Entered and Authorized by:   Tresa Garter MD   Signed by:   Tresa Garter MD on 10/11/2010   Method used:   Print then Give to Patient   RxID:   [id number]    Orders Added: 1)  Est. Patient Level IV [35009]    Contraindications/Deferment of Procedures/Staging:    Test/Procedure: FLU VAX    Reason for deferment: patient declined

## 2010-12-26 NOTE — Assessment & Plan Note (Signed)
Summary: 6 MO ROV /NWS  #   Vital Signs:  Patient profile:   77 year old male Height:      68 inches Weight:      180.38 pounds BMI:     27.53 O2 Sat:      95 % on Room air Temp:     98.2 degrees F oral Pulse rate:   68 / minute BP sitting:   142 / 80  (left arm) Cuff size:   regular  Vitals Entered By: Lucious Groves (Apr 12, 2010 11:20 AM)  O2 Flow:  Room air CC: follow-up visit./kb Is Patient Diabetic? No Pain Assessment Patient in pain? no        CC:  follow-up visit./kb.  History of Present Illness: The patient presents for a follow up of hypertension, L knee pain,  hyperlipidemia   Current Medications (verified): 1)  Norvasc 10 Mg Tabs (Amlodipine Besylate) .Marland Kitchen.. 1 By Mouth Qd 2)  Levothroid 100 Mcg Tabs (Levothyroxine Sodium) .Marland Kitchen.. 1 By Mouth Qd 3)  Crestor 10 Mg Tabs (Rosuvastatin Calcium) .... Take 1 Tab By Mouth Daily 4)  Aspir-Low 81 Mg Tbec (Aspirin) .Marland Kitchen.. 1 Once Daily After Meal 5)  Atenolol-Chlorthalidone 50-25 Mg Tabs (Atenolol-Chlorthalidone) .Marland Kitchen.. 1 By Mouth Every Day 6)  Tramadol Hcl 50 Mg  Tabs (Tramadol Hcl) .Marland Kitchen.. 1or2 Two Times A Day  Prn 7)  Flomax 0.4 Mg Cp24 (Tamsulosin Hcl) .... Take 1 Capsule By Mouth Daily 8)  Vitamin D3 1000 Unit  Tabs (Cholecalciferol) .Marland Kitchen.. 1 Qd 9)  Pennsaid 1.5 % Soln (Diclofenac Sodium) .... 3-5 Gtt To Skin Three Times Daily 10)  Ciprofloxacin Hcl 250 Mg Tabs (Ciprofloxacin Hcl) .Marland Kitchen.. 1 By Mouth Bid  Allergies (verified): No Known Drug Allergies  Past History:  Past Medical History: Colon cancer, hx of   2005  Dr Dalene Carrow  Dr Arlyce Dice Hypertension Osteoarthritis Dysarthria Hypothyroidism Skin cancer, hx of 2009 UTI 2009, 2011 Cerebrovascular accident, hx of Psoriasis  Family History: Reviewed history from 12/03/2007 and no changes required. Family History Hypertension  Social History: Reviewed history from 12/03/2007 and no changes required. Retired Married Former Smoker  Physical Exam  General:  NAD Nose:   WNL Mouth:  WNL Lungs:  Normal respiratory effort, chest expands symmetrically. Lungs are clear to auscultation, no crackles or wheezes. Heart:  Normal rate and regular rhythm. S1 and S2 normal without gallop, murmur, click, rub or other extra sounds. Abdomen:  Bowel sounds positive,abdomen soft and non-tender without masses, organomegaly or hernias noted. Msk:  No deformity or scoliosis noted of thoracic or lumbar spine.  B knees w/OA deformities, tender Using a cane now L knee is tender w/ROM Neurologic:  Dysarthric, aphasic Skin:  Aging changes Psych:  Cognition and judgment appear intact. Alert and cooperative with normal attention span and concentration   Impression & Recommendations:  Problem # 1:  UTI (ICD-599.0) Assessment New  His updated medication list for this problem includes:    Ciprofloxacin Hcl 250 Mg Tabs (Ciprofloxacin hcl) .Marland Kitchen... 1 by mouth bid The labs were reviewed with the patient.   Problem # 2:  HYPOTHYROIDISM (ICD-244.9)  His updated medication list for this problem includes:    Levothroid 100 Mcg Tabs (Levothyroxine sodium) .Marland Kitchen... 1 by mouth qd  Problem # 3:  OSTEOARTHRITIS (ICD-715.90) mostly L knee Assessment: Deteriorated  His updated medication list for this problem includes:    Aspir-low 81 Mg Tbec (Aspirin) .Marland Kitchen... 1 once daily after meal    Tramadol Hcl 50 Mg Tabs (  Tramadol hcl) .Marland Kitchen... 1or2 two times a day  prn  Problem # 4:  COLON CANCER, HX OF (ICD-V10.05) Assessment: Comment Only Refusing colonoscopy  Problem # 5:  CEREBROVASCULAR ACCIDENT, HX OF (ICD-V12.50) Assessment: Unchanged Remains aphasic  Problem # 6:  HYPERTENSION (ICD-401.9) Assessment: Unchanged  His updated medication list for this problem includes:    Norvasc 10 Mg Tabs (Amlodipine besylate) .Marland Kitchen... 1 by mouth qd    Atenolol-chlorthalidone 50-25 Mg Tabs (Atenolol-chlorthalidone) .Marland Kitchen... 1 by mouth every day  BP today: 142/80 Prior BP: 120/70 (10/12/2009)  Labs Reviewed: K+:  3.8 (04/08/2010) Creat: : 1.1 (04/08/2010)   Chol: 127 (04/08/2010)   HDL: 44.50 (04/08/2010)   LDL: 62 (04/08/2010)   TG: 102.0 (04/08/2010)  Complete Medication List: 1)  Norvasc 10 Mg Tabs (Amlodipine besylate) .Marland Kitchen.. 1 by mouth qd 2)  Levothroid 100 Mcg Tabs (Levothyroxine sodium) .Marland Kitchen.. 1 by mouth qd 3)  Crestor 10 Mg Tabs (Rosuvastatin calcium) .... Take 1 tab by mouth daily 4)  Aspir-low 81 Mg Tbec (Aspirin) .Marland Kitchen.. 1 once daily after meal 5)  Atenolol-chlorthalidone 50-25 Mg Tabs (Atenolol-chlorthalidone) .Marland Kitchen.. 1 by mouth every day 6)  Tramadol Hcl 50 Mg Tabs (Tramadol hcl) .Marland Kitchen.. 1or2 two times a day  prn 7)  Flomax 0.4 Mg Cp24 (Tamsulosin hcl) .... Take 1 capsule by mouth daily 8)  Vitamin D3 1000 Unit Tabs (Cholecalciferol) .Marland Kitchen.. 1 qd 9)  Pennsaid 1.5 % Soln (Diclofenac sodium) .... 3-5 gtt to skin three times daily 10)  Ciprofloxacin Hcl 250 Mg Tabs (Ciprofloxacin hcl) .Marland Kitchen.. 1 by mouth bid  Contraindications/Deferment of Procedures/Staging:    Test/Procedure: Colonoscopy    Reason for deferment: patient declined     Test/Procedure: Pneumovax vaccine    Reason for deferment: patient declined   Patient Instructions: 1)  Please schedule a follow-up appointment in 6 months. 2)  BMP prior to visit, ICD-9: 3)  Hepatic Panel prior to visit, ICD-9: 4)  CBC w/ Diff prior to visit, ICD-9: 5)  Urine-dip prior to visit, ICD-9: 6)  PSA prior to visit, ICD-9: 599.00    995.20  272.20  596.0

## 2010-12-26 NOTE — Letter (Signed)
Summary: [facility]  [facility]   Imported By: Sherian Rein 11/19/2010 15:54:30  _____________________________________________________________________  External Attachment:    Type:   Image     Comment:   External Document

## 2010-12-26 NOTE — Letter (Signed)
[facility]   Imported By: Lennie Odor 03/05/2010 14:20:43  _____________________________________________________________________  External Attachment:    Type:   Image     Comment:   External Document

## 2011-01-02 ENCOUNTER — Other Ambulatory Visit: Payer: Self-pay

## 2011-01-14 ENCOUNTER — Other Ambulatory Visit: Payer: Self-pay | Admitting: Internal Medicine

## 2011-01-14 ENCOUNTER — Encounter (INDEPENDENT_AMBULATORY_CARE_PROVIDER_SITE_OTHER): Payer: Self-pay | Admitting: *Deleted

## 2011-01-14 ENCOUNTER — Other Ambulatory Visit: Payer: Medicare Other

## 2011-01-14 DIAGNOSIS — I10 Essential (primary) hypertension: Secondary | ICD-10-CM

## 2011-01-14 DIAGNOSIS — N390 Urinary tract infection, site not specified: Secondary | ICD-10-CM

## 2011-01-14 LAB — BASIC METABOLIC PANEL
BUN: 29 mg/dL — ABNORMAL HIGH (ref 6–23)
CO2: 31 mEq/L (ref 19–32)
Calcium: 9.3 mg/dL (ref 8.4–10.5)
Creatinine, Ser: 1.2 mg/dL (ref 0.4–1.5)
GFR: 60.48 mL/min (ref >60.00)
Glucose, Bld: 91 mg/dL (ref 70–99)
Sodium: 141 mEq/L (ref 135–145)

## 2011-01-14 LAB — URINALYSIS, ROUTINE W REFLEX MICROSCOPIC
Bilirubin Urine: NEGATIVE
Ketones, ur: NEGATIVE
Specific Gravity, Urine: 1.015 (ref 1.000–1.030)
Urine Glucose: NEGATIVE
Urobilinogen, UA: 0.2 (ref 0.0–1.0)

## 2011-01-16 ENCOUNTER — Encounter: Payer: Self-pay | Admitting: Internal Medicine

## 2011-01-17 ENCOUNTER — Encounter: Payer: Self-pay | Admitting: Internal Medicine

## 2011-01-17 ENCOUNTER — Ambulatory Visit (INDEPENDENT_AMBULATORY_CARE_PROVIDER_SITE_OTHER): Payer: Medicare Other | Admitting: Internal Medicine

## 2011-01-17 DIAGNOSIS — E039 Hypothyroidism, unspecified: Secondary | ICD-10-CM

## 2011-01-17 DIAGNOSIS — N289 Disorder of kidney and ureter, unspecified: Secondary | ICD-10-CM

## 2011-01-17 DIAGNOSIS — N390 Urinary tract infection, site not specified: Secondary | ICD-10-CM

## 2011-01-17 DIAGNOSIS — I10 Essential (primary) hypertension: Secondary | ICD-10-CM

## 2011-01-21 NOTE — Miscellaneous (Signed)
  Clinical Lists Changes  Medications: Added new medication of CIPRO 500 MG TABS (CIPROFLOXACIN HCL) take 1 by mouth two times a day - Signed Rx of CIPRO 500 MG TABS (CIPROFLOXACIN HCL) take 1 by mouth two times a day;  #20 x 0;  Signed;  Entered by: Lanier Prude, CMA(AAMA);  Authorized by: Tresa Garter MD;  Method used: Electronically to [facility]  [address] [phone]*, [address], [place], [place]  [address], Ph: [phone] or [phone], Fax: [phone]    Prescriptions: CIPRO 500 MG TABS (CIPROFLOXACIN HCL) take 1 by mouth two times a day  #20 x 0   Entered by:   Lanier Prude, CMA(AAMA)   Authorized by:   Tresa Garter MD   Signed by:   Lanier Prude, [facility]) on 01/16/2011   Method used:   Electronically to        [facility]  [address] [phone]* (retail)       [address]       [place], [place]  [address]       Ph: [phone] or [phone]       Fax: [phone]   RxID:   [id number]

## 2011-01-30 NOTE — Assessment & Plan Note (Signed)
Summary: 3 MTH FU W/SKIN BX--STC   Vital Signs:  Patient profile:   77 year old male Height:      68 inches Weight:      179 pounds BMI:     27.32 Temp:     98.6 degrees F oral Pulse rate:   88 / minute Pulse rhythm:   regular Resp:     16 per minute BP sitting:   110 / 60  (left arm) Cuff size:   regular  Vitals Entered By: Lanier Prude, CMA(AAMA) (January 17, 2011 9:46 AM) CC: 3 mo f/u  Is Patient Diabetic? No Comments pt is not taking Tramadol or Pennsaid   CC:  3 mo f/u .  History of Present Illness: F/u AKs and UTIs  and HTN, CRI  Current Medications (verified): 1)  Norvasc 10 Mg Tabs (Amlodipine Besylate) .Marland Kitchen.. 1 By Mouth Qd 2)  Levothroid 100 Mcg Tabs (Levothyroxine Sodium) .Marland Kitchen.. 1 By Mouth Qd 3)  Crestor 10 Mg Tabs (Rosuvastatin Calcium) .... Take 1 Tab By Mouth Daily 4)  Aspir-Low 81 Mg Tbec (Aspirin) .Marland Kitchen.. 1 Once Daily After Meal 5)  Atenolol-Chlorthalidone 50-25 Mg Tabs (Atenolol-Chlorthalidone) .Marland Kitchen.. 1 By Mouth Every Day 6)  Tramadol Hcl 50 Mg  Tabs (Tramadol Hcl) .Marland Kitchen.. 1or2 Two Times A Day  Prn 7)  Flomax 0.4 Mg Cp24 (Tamsulosin Hcl) .... Take 1 Capsule By Mouth Daily 8)  Vitamin D3 1000 Unit  Tabs (Cholecalciferol) .Marland Kitchen.. 1 Qd 9)  Pennsaid 1.5 % Soln (Diclofenac Sodium) .... 3-5 Gtt To Skin Three Times Daily 10)  Cipro 500 Mg Tabs (Ciprofloxacin Hcl) .... Take 1 By Mouth Two Times A Day  Allergies (verified): No Known Drug Allergies  Past History:  Past Medical History: Last updated: 10/11/2010 Colon cancer, hx of   2005  Dr Dalene Carrow  Dr Arlyce Dice Hypertension Osteoarthritis Dysarthria Hypothyroidism Skin cancer, hx of 2009 UTI 2009, 2011 Cerebrovascular accident, hx of Psoriasis Tremor  Past Surgical History: Last updated: 12/01/2008 Colectomy  Social History: Last updated: 12/03/2007 Retired Married Former Smoker  Review of Systems       The patient complains of dyspnea on exertion and suspicious skin lesions.  The patient denies fever,  chest pain, hematuria, genital sores, and depression.    Physical Exam  General:  NAD Nose:  WNL Mouth:  WNL Abdomen:  Bowel sounds positive,abdomen soft and non-tender without masses, organomegaly or hernias noted. Msk:  No deformity or scoliosis noted of thoracic or lumbar spine.  B knees w/OA deformities, tender Using a 4 prong cane  L knee is tender w/ROM Extremities:  no edema B Neurologic:  Dysarthric, aphasic Lower lip w/tremor L hand w/mild tremor Skin:  Aging changes 11 mm L temple growth an 6 mm nest to L ear Psych:  Cognition and judgment appear intact. Alert and cooperative with normal attention span and concentration   Impression & Recommendations:  Problem # 1:  UTI (ICD-599.0) chronic Assessment Deteriorated Options to manage were discussed His updated medication list for this problem includes:    Cipro 500 Mg Tabs (Ciprofloxacin hcl) .Marland Kitchen... Take 1 by mouth two times a day    Ciprofloxacin Hcl 500 Mg Tabs (Ciprofloxacin hcl) .Marland Kitchen... 1 by mouth once daily m-w-f  Problem # 2:  HYPERTENSION (ICD-401.9) Assessment: Improved  His updated medication list for this problem includes:    Norvasc 10 Mg Tabs (Amlodipine besylate) .Marland Kitchen... 1 by mouth qd    Atenolol-chlorthalidone 50-25 Mg Tabs (Atenolol-chlorthalidone) .Marland Kitchen... 1 by mouth every day  Problem # 3:  HYPERCHOLESTEROLEMIA (ICD-272.0) Assessment: Improved  His updated medication list for this problem includes:    Crestor 10 Mg Tabs (Rosuvastatin calcium) .Marland Kitchen... Take 1 tab by mouth daily  Problem # 4:  RENAL INSUFFICIENCY (ICD-593.9) Assessment: Improved The labs were reviewed with the patient.   Problem # 5:  NEOPLASM OF UNCERTAIN BEHAVIOR OF SKIN (ICD-238.2) Assessment: Deteriorated He refused biopsy or cryo today  Complete Medication List: 1)  Norvasc 10 Mg Tabs (Amlodipine besylate) .Marland Kitchen.. 1 by mouth qd 2)  Levothroid 100 Mcg Tabs (Levothyroxine sodium) .Marland Kitchen.. 1 by mouth qd 3)  Crestor 10 Mg Tabs (Rosuvastatin  calcium) .... Take 1 tab by mouth daily 4)  Aspir-low 81 Mg Tbec (Aspirin) .Marland Kitchen.. 1 once daily after meal 5)  Atenolol-chlorthalidone 50-25 Mg Tabs (Atenolol-chlorthalidone) .Marland Kitchen.. 1 by mouth every day 6)  Tramadol Hcl 50 Mg Tabs (Tramadol hcl) .Marland Kitchen.. 1or2 two times a day  prn 7)  Flomax 0.4 Mg Cp24 (Tamsulosin hcl) .... Take 1 capsule by mouth daily 8)  Vitamin D3 1000 Unit Tabs (Cholecalciferol) .Marland Kitchen.. 1 qd 9)  Pennsaid 1.5 % Soln (Diclofenac sodium) .... 3-5 gtt to skin three times daily 10)  Cipro 500 Mg Tabs (Ciprofloxacin hcl) .... Take 1 by mouth two times a day 11)  Ciprofloxacin Hcl 500 Mg Tabs (Ciprofloxacin hcl) .Marland Kitchen.. 1 by mouth once daily m-w-f  Patient Instructions: 1)  Please schedule a follow-up appointment in 3 months. 2)  BMP prior to visit, ICD-9: 3)  Urine-dip prior to visit, ICD-9:599.0 4)  Can try Align Prescriptions: CIPROFLOXACIN HCL 500 MG TABS (CIPROFLOXACIN HCL) 1 by mouth once daily M-W-F  #90 x 1   Entered and Authorized by:   Tresa Garter MD   Signed by:   Tresa Garter MD on 01/17/2011   Method used:   Print then Give to Patient   RxID:   [id number] CIPROFLOXACIN HCL 500 MG TABS (CIPROFLOXACIN HCL) 1 by mouth once daily M-W-F  #30 x 6   Entered and Authorized by:   Tresa Garter MD   Signed by:   Tresa Garter MD on 01/17/2011   Method used:   Print then Give to Patient   RxID:   [id number]    Orders Added: 1)  Est. Patient Level IV [84132]

## 2011-04-11 NOTE — Discharge Summary (Signed)
NAMETHERAN, VANDERGRIFT                           ACCOUNT NO.:  [id number]   MEDICAL RECORD NO.:  [id number]                   PATIENT TYPE:  INP   LOCATION:  0371                                 FACILITY:  [facility]   PHYSICIAN:  Adolph Pollack, M.D.            DATE OF BIRTH:  [birth year]   DATE OF ADMISSION:  07/16/2004  DATE OF DISCHARGE:  07/22/2004                                 DISCHARGE SUMMARY   PRINCIPAL DISCHARGE DIAGNOSIS:  Stage III colon cancer.   SECONDARY DIAGNOSES:  1. Postoperative ileus.  2. Bladder stone.  3. Hypertension.  4. Hypothyroidism.  5. Hyperlipidemia.   PROCEDURE:  Low anterior resection of the rectum, as well as a cystoscopy,  cystotomy, and removal of bladder stone.   REASON FOR ADMISSION:  Mr. Granja is a 77 year old male who was admitted to  [facility] in May with urinary tract infection, acute renal  insufficiency, and a secreting villous adenoma.  He eventually was able to  overcome all of this, and improved enough to undergo an operation as listed  above.  He was admitted on July 16, 2004, for that.   HOSPITAL COURSE:  He underwent the above procedures, and actually fairly  well from them.  He had some mild hypokalemia and this was corrected.  He  was started on a clear liquid diet his second postoperative day, and the  Norvasc was started to keep his blood pressure under control.  Dr. Wanda Plump  felt that his Foley needed to remain up to 14 days, and he would repeat a  cystogram.  I began getting him to ambulate and advance his diet slowly.  He  continued to improve, and by his six postoperative day his bowels were  moving, he was tolerating a solid diet, wound was healing well.  He did have  a drain in which I removed and the Foley was placed to a leg bag and he was  ready to be discharged.  Pathology demonstrated a T3 N1 adenocarcinoma with  1:15 lymph nodes positive.   DISPOSITION:  Discharged to home in satisfactory condition  on July 22, 2004.  Staples will be removed prior to discharge.   ACTIVITY:  He was given activity restrictions.   DISCHARGE MEDICATIONS:  1. Told to take most of his normal medications.  2. Vicodin for pain.   FOLLOWUP:  He will come back and see Dr. Wanda Plump in one week, see me in  two weeks, and we will set up a referral to the [facility] as  well.                                               Adolph Pollack, M.D.    Kari Baars  D:  07/22/2004  T:  07/22/2004  Job:  [id number]   cc:   [name], M.D. [facility]   Molly Maduro D. Arlyce Dice, M.D. [facility]

## 2011-04-11 NOTE — Op Note (Signed)
NAME:  [name] [name], [name] [name] NO.:  [id number]   MEDICAL RECORD NO.:  [id number]                   PATIENT TYPE:  INP   LOCATION:  0005                                 FACILITY:  [facility]   PHYSICIAN:  Boston Service, M.D.             DATE OF BIRTH:  [birth year]   DATE OF PROCEDURE:  07/16/2004  DATE OF DISCHARGE:                                 OPERATIVE REPORT   GASTROENTEROLOGY:  Barnet Pall, MD   INTERNAL MEDICINE:  Sonda Primes, MD   GENERAL SURGERY:  Avel Peace, MD   FAMILY PRACTICE:  1. [name], MD  2. [name], MD   UROLOGY:  Boston Service, MD.   PREOPERATIVE DIAGNOSIS:  Longstanding history of benign prostatic  hypertrophy, PSA 1.2-1.1-0.9 over the last 8 years, BX1 of 97, BHP, ERE  shows previous area of focal nodularity has resolved.  The patient was  hospitalized in May 2004.  Careful follow up with [name], MD,  included CT scan chest, abdomen, and pelvis mild right hydronephrosis,  radiologic suspicion of a colovesical fistula, patient has a large bladder  calculus and a large rectal mass.  Subsequently GI work-up revealed this to  be a villous adenoma (secreting) biopsy no evidence of cancer.  Follow up in  June 2005, cystoscopy 1.8 cm bladder stone.  The patient had area of intense  erythema left posterior bladder wall, difficult to be certain if this was a  colovesical fistula or irritation from the indwelling calculus.  Renal  ultrasound 10.4 cm right kidney, 10.3 cm left kidney, 25 mm cyst, 10 mm  cyst, question hyperdense area within the mid pole of the left kidney, 17 x  13 mm.  Decision for the time being to follow this area conservatively,  tentatively planning on follow up CT scan in November 2005.  The patient has  had follow up with Dr. Posey Rea, internal medicine at [facility], Dr. Arlyce Dice,  GI at [facility], and Dr. Abbey Chatters, [facility].  Plans made for  cystoscopy, retrogrades, open  cystolithopaxy, and then segmental colectomy  with Dr. Abbey Chatters.   POSTOPERATIVE DIAGNOSIS:  Same.   PROCEDURE:  1. Cystoscopy.  2. Retrogrades.  3. Right and left stent placement.  4. Cystolithotomy.  Procedure was coordinated with Dr. Abbey Chatters.   ANESTHESIA:  General.   DRAINS:  22 French Foley.   DESCRIPTION OF PROCEDURE:  The patient was prepped and draped in the dorsal  lithotomy position after institution of an adequate level of general  anesthesia.  Well-lubricated 21 French panendoscope was gently inserted at  the urethral meatus, normal urethra surprisingly short and nonobstructive  prostatic urethra, normal trigonal anatomy.  The patient had a 2 cm stone  within the bladder with area of intense erythema along the left posterior  bladder wall.  Right and left retrogrades, somewhat surprisingly, showed no  evidence of hydronephrosis, filling defect, or obstruction.  Both kidneys  drained out promptly at 3-5 minutes.  There was mild tortuosity of the left  distal ureter but no evidence of obstruction.  Floppy-tip guidewire was then  inserted.  End-hole catheter was passed over the floppy-tip guidewire.  End-  hole catheter was then advanced to the renal pelvis on both the right and  left sides.  Floppy-tip guidewire was removed.  Lunderquist ring torque wire  was inserted inside of the end-hole catheter at a distance of about 20 cm.  Care was taken to avoid injury to the ureter.  A 22 French Foley was then  inserted after removal of the cystoscope.  Stents were then tied to the 22  [place] Foley.  Bladder was filled with about 350 mL of saline, and catheter  was then clamped.  The patient was reprepped and draped.  Midline  infraumbilical incision made by Dr. Abbey Chatters.  Peritoneal cavity was  entered without difficulty.  Small transverse incision was made along the  bladder dome through the perivesical fat and muscular layers of the bladder.  Indwelling Foley catheter,  right and left stents easily visualized as was  patient's 2 cm bladder stone which was grasped and withdrawn.  Bladder was  then closed in two layers with 2-0 Vicryl.  Foley catheter was left to  straight drain.  Dr. Abbey Chatters will dictate his portion of the procedure.                                               Boston Service, M.D.    RH/MEDQ  D:  07/16/2004  T:  07/16/2004  Job:  [id number]   cc:   [name], M.D.  Fax: [phone]   Stan Head. Cleta Alberts, M.D.  [address]  [place]  [place] [address]  Fax: [phone]   [name], M.D. [facility]   Adolph Pollack, M.D.  [address]  [place]  [place] [address]  Fax: [phone]   Barbette Hair. Arlyce Dice, M.D. [facility]

## 2011-04-11 NOTE — Assessment & Plan Note (Signed)
[facility]                             PRIMARY CARE OFFICE NOTE   CHAYANNE, SPEIR                        MRN:          [id number]  DATE:07/28/2006                            DOB:          1923/09/03    The patient is an 77 year old male who presents for a well examination.   PAST MEDICAL HISTORY FAMILY HISTORY AND SOCIAL HISTORY:  As per July 01, 2005 note.   CURRENT MEDICINES:  Reviewed with the patient.   ALLERGIES:  Negative.   REVIEW OF SYSTEMS:  Negative for weight loss.  No blood in the stool.  He  has been feeling well, except for the past 2-3 days, when he has been sick  with a cold, cough productive for colored sputum, sore throat, fevers.  The  rest is negative.   PHYSICAL EXAMINATION:  VITAL SIGNS:  Blood pressure 138/78, pulse 114,  temperature 99, weight 175 pounds.  GENERAL:  He is in no acute distress.  HEENT:  With moist mucosa.  Erythematous throat.  NECK:  Supple.  No thyromegaly.  LUNGS:  Clear.  No wheezes or rales.  HEART:  S1, S2.  Grade 1-2/6 systolic murmur, tachycardic.  ABDOMEN:  Soft, nontender.  No organomegaly or masses felt.  LOWER EXTREMITIES:  Without edema.  Calves nontender.  RECTAL:  Reveals slightly enlarged prostate.  No nodules.  No rectal masses.  Stool guaiac negative.  NEUROLOGIC:  Alert, oriented, cooperative.  __________.  Slightly  dysarthric.  Otherwise, neurologic exam is nonfocal.   LABORATORIES:  On July 23, 2006, CBC normal.  Cholesterol 132.  CMET  normal.  Glucose 111.  TSH normal.  PSA 1.74.  Urinalysis normal.  EKG today  with sinus tachycardia, heart rate 102.   ASSESSMENT AND PLAN:  1. Normal wellness examination.  Age/health related issues discussed.      Healthy lifestyle discussed.  I will see him back in two months, and we      will discuss vaccination at the time.  Continue with healthy lifestyle.      Repeat exam in 12 months.  2. Colon cancer diagnosed in 2005.   Follow up with Dr. Dalene Carrow later this      week.  Obtain chest x-ray.  3. Upper respiratory infection.  Prescribed Z-PAK for 3 days, 1 once a day      for 3 days.  Call if problems.  Tylenol p.r.n.  4. Hypertension, controlled.  Continue with current therapy.  5. Sinus tachycardia, possibly due to fever and being sick with a cold.      If his heart rate      continues to be up in 1-2 days, I asked him to start atenolol 25 mg      daily.  I will see him back in two months.                                   [name], MD   AVP/MedQ  DD:  07/28/2006  DT:  07/29/2006  Job #:  [id number]   cc:   Vicente Serene I. Odogwu, M.D.

## 2011-04-11 NOTE — Consult Note (Signed)
NAMEPATRIC, BUCKHALTER                           ACCOUNT NO.:  [id number]   MEDICAL RECORD NO.:  [id number]                   PATIENT TYPE:  INP   LOCATION:  5156                                 FACILITY:  [facility]   PHYSICIAN:  Adolph Pollack, M.D.            DATE OF BIRTH:  1922/11/28   DATE OF CONSULTATION:  04/19/2004  DATE OF DISCHARGE:                                   CONSULTATION   REASON FOR CONSULTATION:  Secreting villous adenoma.   HISTORY OF PRESENT ILLNESS:  This is a 77 year old male admitted Apr 16, 2004, with urinary tract infection, acute renal insufficiency, failure to  thrive.  He was started on intravenous hydration.  He was also noted to have  diarrhea which is hemoccult positive, and a GI consultation was obtained.  Clostridium difficile was negative.  A colonoscopy was obtained.  This  demonstrated a villous adenoma in the sigmoid colon approximately 15 cm from  the anus.  No biopsies were performed today.  He also had a CT scan ordered  for today.  He has had increasing diarrhea.  I was asked to see him to  discuss operative management of this lesion.   PAST MEDICAL HISTORY:  1. Hypertension.  2. Hyperlipidemia.  3. Hypothyroidism.  4. Alcohol abuse.   PREVIOUS OPERATIONS:  None.   ALLERGIES:  None.   CURRENT MEDICATIONS:  1. Levothyroxine.  2. Potassium.  3. Flomax.   SOCIAL HISTORY:  He is married.  His family is in the room with him.  He is  a former smoker.  He has a history of intermittent significant alcohol use.   FAMILY HISTORY:  Positive for gastric cancer in his sister, and lung cancer  in a brother.  Also, he says hypertension runs in his family.   REVIEW OF SYSTEMS:  CARDIAC:  He denies any known heart disease or murmur.  PULMONARY:  He denies emphysema, pneumonia, tuberculosis, asthma.  GI:  He  denies peptic ulcer disease, hepatitis, diverticulitis.  GU:  He denies any  known kidney stones.  ENDOCRINE:  No diabetes.  He has some  hyperlipidemia.  NEUROLOGIC:  Apparently, he has been having some slurred speech, but had a  recent MRI which demonstrated atrophy and old small vessel disease, but no  evidence of acute infarct.   PHYSICAL EXAMINATION:  GENERAL:  An elderly male, in no acute distress,  pleasant and cooperative.  VITAL SIGNS:  He is afebrile with normal vital signs.  SKIN:  No jaundice.  HEENT:  Eyes:  Extraocular motions intact.  No icterus noted.  NECK:  Supple without palpable masses or obvious thyroid enlargement.  RESPIRATORY:  Breath sounds equal, clear respirations are noted.  CARDIOVASCULAR:  Demonstrates a regular rate and rhythm, no murmur heard.  EXTREMITIES:  No lower-extremity JVD.  Palpable pedal pulses are present.  ABDOMEN:  Soft, nontender, nondistended.  No palpable masses, organomegaly  or  hernias.  RECTAL:  Exam demonstrates decreased sphincter tone, and some thin, liquid  stool in the rectal vault.  MUSCULOSKELETAL:  He has good muscle tone, full range of motion, normal gait  and station.  NEUROLOGIC:  He does have a little bit of slow speech, but does answer  questions appropriately.   LABORATORY DATA:  On the 26th, demonstrated white count of 15,300, potassium  3.2, BUN 26, creatinine 1.0.  Stool for Clostridium difficile was negative.   Colonoscopy results and pictures were seen.   IMPRESSION:  Villous adenoma of the sigmoid colon at the rectosigmoid  junction.  There has been some diarrhea.  He has had problems with  incontinence now for two years.  His family tells me it has worsened with  the increasing diarrhea.  This appears to be causing some hypokalemia as  well.  He is recovering from urosepsis.  CT scan has been done.  This has  been done to rule out metastatic disease.  A biopsy is pending of the  villous lesion.   PLAN:  I had a long, detailed discussion with him and his family detailing  surgical options including partial colectomy with or without colostomy.   If  he is having persistent incontinence, it may indeed be related to secretory  diarrhea from this lesion.  However, there are no guarantees that he will  not have some diarrhea and problems with control after this, thus the reason  for potential colostomy.   The other option is to not do anything about this and try to treat the  hypokalemia and slow his bowels down, and that is his choice.  I have  discussed the operation with him and the risks, including but not limited to  bleeding, infection, anastomotic dehiscence, damage to abdominal organs such  as intestine, kidney, bladder, ureter, etc and cardiopulmonary risk of  anesthesia.  They seem to understand all this.  He is not ready to make a  decision at this time.  They are hoping to be able to go home for the  weekend, and in deed we can potentially schedule him for an elective partial  colectomy if that is the route he chooses.                                               Adolph Pollack, M.D.    Kari Baars  D:  04/19/2004  T:  04/21/2004  Job:  [id number]   cc:   [name], M.D.  Fax: [phone]   Barbette Hair. Arlyce Dice, M.D. [facility]

## 2011-04-11 NOTE — H&P (Signed)
NAME:  [name] [name], [name] [name]                           ACCOUNT NO.:  [id number]   MEDICAL RECORD NO.:  [id number]                   PATIENT TYPE:  INP   LOCATION:  0005                                 FACILITY:  [facility]   PHYSICIAN:  Adolph Pollack, M.D.            DATE OF BIRTH:  Mar 22, 1923   DATE OF ADMISSION:  07/16/2004  DATE OF DISCHARGE:                                HISTORY & PHYSICAL   REASON FOR ADMISSION:  Elective surgery.   HISTORY OF PRESENT ILLNESS:  [name] [name] is a 77 year old male admitted on  Apr 16, 2004 to [facility] with urinary tract infection, acute  renal insufficiency, and failure to thrive.  He had a Hemoccult positive  stool and was found to have a secreting villous adenoma of the rectum.  CT  scan demonstrated this as well as some question of a colovesical fistula.  There is also a bladder stone and a question of a small renal mass.  He has  been seen by Dr. Wanda Plump.  Eventually, he improved over the couple of  months with some time in the hospital but mostly at home.  I saw him back in  the office.  He was eating well at that time.  He was stable.  He came in to  discussed operative management of his secreting villous adenoma.  We went  over a partial colectomy, possible colostomy, and partial cystectomy.  He  saw Dr. Wanda Plump as well.  He now presents for cystoscopy and stone  extraction by Dr. Wanda Plump and possible take down of colovesical fistula  and voluntary resection by me.  The procedure and the risks have been  discussed with him preoperatively.   PAST MEDICAL HISTORY:  1. Hypertension.  2. Hyperlipidemia.  3. Hypothyroidism.  4. Acute renal failure.  5. UTI.  6. Alcohol abuse.  7. Villous adenoma.  8. Bladder stones.   PAST SURGICAL HISTORY:  None.   ALLERGIES:  None.   MEDICATIONS:  Include L-thyroxine, Flomax, Norvasc, Imodium b.i.d., Tylenol  ES, Crestor, Macrobid.   SOCIAL HISTORY:  Married.  A 40-pack-year history of  smoking.  Used to use  alcohol heavily, apparently.   REVIEW OF SYSTEMS:  Please see the health history assessment and evaluated  by me as well.   PHYSICAL EXAMINATION:  VITAL SIGNS:  Temperature 98.6, blood pressure  130/70, pulse 90.  GENERAL:  An elderly male in no acute distress.  Pleasant, cooperative.  HEENT:  Eyes:  Extraocular motions are intact.  No icterus.  NECK:  Supple without masses or thyroid enlargement.  RESPIRATORY:  Breath sounds are equal and quiet.  Respirations are  unlabored.  CARDIOVASCULAR:  Regular rate and rhythm with a soft murmur.  No lower  extremity edema.  ABDOMEN:  Soft, nontender, nondistended.  No palpable masses or hernia  present.  MUSCULOSKELETAL:  He actually has good muscle tone.   IMPRESSION:  1.  Large secreting rectal villous adenoma by biopsy.  2. Possible colovesical fistula.  3. Bladder stone.   PLAN:  Will proceed to the operating room, as above.                                               Adolph Pollack, M.D.    Kari Baars  D:  07/16/2004  T:  07/16/2004  Job:  [id number]

## 2011-04-11 NOTE — Assessment & Plan Note (Signed)
[facility]                           PRIMARY CARE OFFICE NOTE   [name] [name], [name] [name]                        MRN:          [id number]  DATE:01/15/2007                            DOB:          07-Jun-1923    PROCEDURE:  Skin biopsy.   INDICATIONS:  Growth on the left temple.   Risks including scar formation, incomplete procedure, bleeding,  infection, as well as benefits were explained to the patient in detail.  He agreed to proceed.   He was placed into the right decubitus position.  The area was prepped  with Betadine and alcohol and injected with 1 cc 2% lidocaine with  epinephrine.  Shave biopsy with Dermablade was performed in the center  of the lesion and sent for pathology.  The rest of the lesion was  removed with the Hyfrecator and had probably a 1 mm thickness.  Granulating tissue under the lesion was exposed and the keratin  mechanically removed.   Band-Aid with antibiotic ointment.  Tolerated well.   COMPLICATIONS:  None.     [name], MD  Electronically Signed    AVP/MedQ  DD: 01/15/2007  DT: 01/15/2007  Job #: [id number]

## 2011-04-11 NOTE — Discharge Summary (Signed)
NAME:  [name] [name], [name] [name]                           ACCOUNT NO.:  [id number]   MEDICAL RECORD NO.:  [id number]                   PATIENT TYPE:  INP   LOCATION:  5156                                 FACILITY:  [facility]   PHYSICIAN:  Wayne A. Sheffield [name], M.D.                 DATE OF BIRTH:  09-Sep-1923   DATE OF ADMISSION:  04/16/2004  DATE OF DISCHARGE:  04/20/2004                                 DISCHARGE SUMMARY   DISCHARGE DIAGNOSES:  1.  Urinary tract infection.  2.  Benign prostatic hypertrophy.  3.  Chronic diarrhea.  4.  Secreting villous adenoma.   DISCHARGE MEDICATIONS:  1.  Cipro XL 1000 mg daily.  2.  Levothyroxine 0.1 mg by mouth daily.  3.  Norvasc 10 mg daily.  4.  Crestor 10 mg daily.  5.  The patient was instructed to hold hydrochlorothiazide until told to      restart by Dr. Lucilla Edin.  6.  Flomax 0.4 mg by mouth nightly.  7.  Imodium p.r.n. for diarrhea.  8.  K-Dur 40 mEq p.o. daily.   DISPOSITION:  Patient discharged to home.   FOLLOWUP:  1.  Dr. Boston Service of urology, May 01, 2004.  2.  Dr. Cleta Alberts of urgent care.   PROCEDURES:  1.  MRI of the brain.  2.  Esophagogastroduodenoscopy and colonoscopy.  3.  CT of the abdomen and chest.   CONSULTS:  1.  General surgery.  2.  Gastroenterology.   BRIEF ADMISSION HISTORY:  This is a 77 year old white male who presented  with a history of undocumented fevers and chills associated with vomiting  x2.  Patient was fatigued.  The patient had been seen at [facility], where his white blood cell count was 24.7 with 70.6%  neutrophils.  Urine culture showed E coli and the patient had been started  on Cipro 1 g a day.  He was given Rocephin x1 prior to admission.  The  patient also had chronic heme-positive hematuria.   HOSPITAL COURSE:  PROBLEM #1 - ACUTE RENAL FAILURE:  Acute renal failure  resolved during hospitalization after hydration and treatment of urinary  tract infection, and beginning Flomax  for benign prostatic hypertrophy.  Patient was afebrile while continuing the Cipro during hospitalization.  Flomax was begun with improvement in urination.   PROBLEM #2 - ALTERED MENTAL STATUS:  Family had complained of slurred speech  and confusion at presentation.  MRI showed atrophy and old small vessel  disease, no evidence of acute reversible process.  Altered mental status was  felt to be secondary to delirium secondary to the patient's acute illness.   PROBLEM #3 - CHRONIC DIARRHEA:  After consulting GI, the patient had a  colonoscopy and EGD.  Colonoscopy shows a large rectal mass which was  biopsied; this showed a secreting villous adenoma.  The patient then had  surgery consult and after much discussion with the surgeon, decided that he  did not want to have the surgery to remove the mass, he would  symptomatically treat the diarrhea with p.r.n. Imodium and to take potassium  to keep his electrolytes balanced.  At the time of discharge, it was unsure  if there was a role for somatostatin in decreasing the secretory activity of  the adenoma.  In order to see if this was malignant or if there were any  other masses, the patient had CT of the abdomen and chest which showed no  chest metastases, multiple renal masses, ? benign versus malignant.  There  was mild right hydroureteronephrosis which was suspect for recent stone  passage.  There was a large calculus in the bladder.  Sigmoid TICS were  noted.  Vesicocolic fistula was noted, considering gas in the bladder or a  recent instrumentation of the bladder.  Bladder wall thickening and large  rectal mass were noted on the CT.      Anastasio Auerbach, MD                          [name] [name], M.D.    AD/MEDQ  D:  07/25/2004  T:  07/27/2004  Job:  [id number]

## 2011-04-11 NOTE — Op Note (Signed)
NAME:  CUYLER, VANDYKEN                           ACCOUNT NO.:  [id number]   MEDICAL RECORD NO.:  [id number]                   PATIENT TYPE:  INP   LOCATION:  0371                                 FACILITY:  [facility]   PHYSICIAN:  Adolph Pollack, M.D.            DATE OF BIRTH:  [birth year]   DATE OF PROCEDURE:  DATE OF DISCHARGE:                                 OPERATIVE REPORT   ADDENDUM:  Prior to closure of the peritoneal cavity, a stab wound was made  in the right lower quadrant and a 19 Blake drain was then placed in the  pelvis and was anchored to the skin with 3-0 Ethilon suture.                                               Adolph Pollack, M.D.    Kari Baars  D:  07/16/2004  T:  07/16/2004  Job:  [id number]

## 2011-04-11 NOTE — Op Note (Signed)
NAME:  [name] [name], [name] [name]                           ACCOUNT NO.:  [id number]   MEDICAL RECORD NO.:  [id number]                   PATIENT TYPE:  INP   LOCATION:  0371                                 FACILITY:  [facility]   PHYSICIAN:  Adolph Pollack, M.D.            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   POSTOPERATIVE DIAGNOSIS:  Large, secreting rectal villous adenoma of the  rectum.   PROCEDURE:  Low anterior resection of the rectum.   SURGEON:  Adolph Pollack, M.D.   ASSISTANT:  Lorelee New, M.D.   ANESTHESIA:  General.   INDICATION:  This is a 77 year old male, who was admitted to the hospital in  May with a very complex course.  He had severe diarrhea and urinary tract  infection and renal failure.  He had Hemoccult positive stool.  He was found  to have a large secreting villous adenoma of the rectum and a questionable  colovesical fistula with bladder stone.  He subsequently was seen by me, and  he has recovered and now presents for the above procedures.   TECHNIQUE:  Dr. Wanda Plump brought him to the operating room and performed  his cystoscopy and a retrograde ureterograms and stent placement as dictated  per his note.  His abdominal wall and perineum were then sterilely prepped  and draped, and he was kept in lithotomy position.  Prior to this, I had  performed rigid proctosigmoidoscopy and noted the lesion to be 12 cm up from  the anus.  After sterilely prepping and draping the abdominal wall and  perineum, a lower midline incision was made and carried up part of the way  around the right side of the umbilicus, incising the skin and subcutaneous  tissue, fascia, and peritoneum.  Adhesions between the appendices epiploica  and bladder were taken down.  There was no  colovesical fistula noted.  At  this point, Dr. Wanda Plump proceeded to perform cystotomy and removed the  bladder stone as dictated per his note.   After completion of Dr. Wanda Plump procedure, I then mobilized the sigmoid  colon and rectum by dividing lateral attachments.  I palpated the stents and  kept my plane of dissection medial to these.  I then divided the colon at  the rectosigmoid junction and divided the mesentery directly posteriorly,  including the inferior mesenteric artery and vein.  Then using the harmonic  scalpel to divide the rectal attachments down into the sacral prominence and  performed a mesorectal dissection using the harmonic scalpel to a level  below the tumor posteriorly and laterally.  I was able to keep the ureters  identified at all times.  Anterior, then I incised the peritoneum and was  able to mobilize  the rectum anteriorly so I could easily get 2-3 cm below  the tumor circumferentially.  I cleaned off the rectum circumferentially  from its fibrofatty attachments using the harmonic scalpel.  I then divided  the rectum below the tumor with the cutting stapler and brought the specimen  to the back field, opened it up, and noticed I had adequate margin from the  tumor.   Following this, I examined the pelvic area, and there was some bleeding in  the pelvis.  I controlled this with harmonic scalpel, clips, and cautery.  I  placed a piece of Surgicel on the raw surface there.  Once it was  hemostatic, I approached the sigmoid stump and removed the staple line and  placed a pursestring suture of 2-0 Prolene around the sigmoid stump.  A size  29 EEA anvil was then put in the stump and the pursestring suture tightened  around it.  The handle was then introduced through the rectum, and a stapled  circular anastomosis was performed with two donuts noted upon removal of the  stapling device.  My anastomosis was patent.  There was no evidence of leak.   Following  this, gloves and gowns were changed.  The pelvis was irrigated.  Hemostasis appeared to be adequate.  Removed all the sponges, and sponge  counts reported to be correct as well as instrument counts.  I then closed  the peritoneum with a running Vicryl suture.  The fascia was then closed  with a running #1 PDS suture.  The subcutaneous tissue was irrigated, and  skin was closed with staples.  Then, as per directions by Dr. Wanda Plump, I  removed the stents and left the Foley in.  He did have some bloody urine  throughout the case.   He did tolerate the case well without any apparent complications.  He  subsequently was extubated and taken to the recovery room in satisfactory  condition.                                               Adolph Pollack, M.D.    Kari Baars  D:  07/16/2004  T:  07/16/2004  Job:  [id number]   cc:   [employer], M.D.  [address]  [place]  [place] [address]  Fax: [phone]   Barbette Hair. Arlyce Dice, M.D. [facility]   Aleksei V. Plotnikov, M.D. [facility]

## 2011-04-14 ENCOUNTER — Other Ambulatory Visit (INDEPENDENT_AMBULATORY_CARE_PROVIDER_SITE_OTHER): Payer: Medicare Other | Admitting: Internal Medicine

## 2011-04-14 ENCOUNTER — Telehealth: Payer: Self-pay | Admitting: Internal Medicine

## 2011-04-14 ENCOUNTER — Other Ambulatory Visit: Payer: Self-pay | Admitting: Internal Medicine

## 2011-04-14 ENCOUNTER — Other Ambulatory Visit (INDEPENDENT_AMBULATORY_CARE_PROVIDER_SITE_OTHER): Payer: Medicare Other

## 2011-04-14 DIAGNOSIS — Z0000 Encounter for general adult medical examination without abnormal findings: Secondary | ICD-10-CM

## 2011-04-14 DIAGNOSIS — N361 Urethral diverticulum: Secondary | ICD-10-CM

## 2011-04-14 LAB — URINALYSIS, ROUTINE W REFLEX MICROSCOPIC
Nitrite: POSITIVE
Specific Gravity, Urine: 1.015 (ref 1.000–1.030)
Total Protein, Urine: NEGATIVE
Urine Glucose: NEGATIVE
Urobilinogen, UA: 0.2 (ref 0.0–1.0)

## 2011-04-14 LAB — BASIC METABOLIC PANEL
CO2: 30 mEq/L (ref 19–32)
Chloride: 101 mEq/L (ref 96–112)
Creatinine, Ser: 1.2 mg/dL (ref 0.4–1.5)
Sodium: 140 mEq/L (ref 135–145)

## 2011-04-14 NOTE — Telephone Encounter (Signed)
Stacey, please,ask lab to cx urine Thx

## 2011-04-15 ENCOUNTER — Other Ambulatory Visit: Payer: Medicare Other

## 2011-04-15 DIAGNOSIS — N3000 Acute cystitis without hematuria: Secondary | ICD-10-CM

## 2011-04-15 NOTE — Telephone Encounter (Signed)
Faxed add on order to lab.

## 2011-04-16 ENCOUNTER — Encounter: Payer: Self-pay | Admitting: Internal Medicine

## 2011-04-16 ENCOUNTER — Telehealth: Payer: Self-pay | Admitting: Internal Medicine

## 2011-04-16 NOTE — Telephone Encounter (Signed)
Misty Stanley, please, ask if lab is doing urine cx abx sensitivity Thx

## 2011-04-17 ENCOUNTER — Ambulatory Visit (INDEPENDENT_AMBULATORY_CARE_PROVIDER_SITE_OTHER): Payer: Medicare Other | Admitting: Internal Medicine

## 2011-04-17 ENCOUNTER — Encounter: Payer: Self-pay | Admitting: Internal Medicine

## 2011-04-17 ENCOUNTER — Telehealth: Payer: Self-pay | Admitting: Internal Medicine

## 2011-04-17 VITALS — BP 102/64 | HR 84 | Temp 98.8°F | Resp 16 | Wt 179.0 lb

## 2011-04-17 DIAGNOSIS — N390 Urinary tract infection, site not specified: Secondary | ICD-10-CM

## 2011-04-17 LAB — URINE CULTURE

## 2011-04-17 MED ORDER — SULFAMETHOXAZOLE-TRIMETHOPRIM 800-160 MG PO TABS: 1.0000 | ORAL_TABLET | Freq: Two times a day (BID) | ORAL | Status: DC

## 2011-04-17 MED ORDER — SULFAMETHOXAZOLE-TRIMETHOPRIM 800-160 MG PO TABS: 1.0000 | ORAL_TABLET | Freq: Once | ORAL | Status: DC

## 2011-04-17 NOTE — Telephone Encounter (Signed)
Pt informed at OV today.

## 2011-04-17 NOTE — Telephone Encounter (Signed)
Misty Stanley, please, inform patient that he has a UTI - the bug is sensitive to Bactrim. Start Rx - RTC 10-14 d Thx

## 2011-04-17 NOTE — Assessment & Plan Note (Addendum)
Start Bactrim Abd CT was ok in 2011 1. Mild degradation secondary to motion and lack of IV contrast.  2. Given this factor, no acute process or evidence of metastatic  disease within the abdomen/pelvis.  3. Moderate L1 compression deformity with trace ventral canal  encroachment. Similar.   He declined daily abx and further w/up

## 2011-04-17 NOTE — Progress Notes (Signed)
  Subjective:    Patient ID: [name] [name], male    DOB: 29-Jun-1923, 77 y.o.   MRN: [id number]  HPI  F/u UTI  Review of Systems  Constitutional: Negative for fever, chills and fatigue.  Genitourinary: Positive for frequency. Negative for dysuria, urgency, hematuria, flank pain, decreased urine volume, enuresis and testicular pain.       Objective:   Physical Exam  Constitutional: No distress.  Neck: No thyromegaly present.  Cardiovascular: Exam reveals no gallop.   Murmur heard. Pulmonary/Chest: No respiratory distress.  Abdominal: He exhibits no mass. There is no tenderness.  Musculoskeletal: He exhibits no edema.  Neurological:       Aphasic       Lab Results  Component Value Date   WBC 10.6* 10/07/2010   HGB 14.0 11/01/2010   HCT 40.4 11/01/2010   PLT 300 11/01/2010   CHOL 127 04/08/2010   TRIG 102.0 04/08/2010   HDL 44.50 04/08/2010   ALT 14 11/01/2010   AST 19 11/01/2010   NA 140 04/14/2011   K 4.0 04/14/2011   CL 101 04/14/2011   CREATININE 1.2 04/14/2011   BUN 33* 04/14/2011   CO2 30 04/14/2011   TSH 2.36 04/08/2010   PSA 2.37 10/07/2010   HGBA1C 5.6 06/11/2009      Assessment & Plan:  UTI Start Bactrim Abd CT was ok in 2011 1. Mild degradation secondary to motion and lack of IV contrast.  2. Given this factor, no acute process or evidence of metastatic  disease within the abdomen/pelvis.  3. Moderate L1 compression deformity with trace ventral canal  encroachment. Similar.   He declined daily abx and further w/up

## 2011-04-17 NOTE — Telephone Encounter (Signed)
busy

## 2011-06-11 ENCOUNTER — Other Ambulatory Visit (INDEPENDENT_AMBULATORY_CARE_PROVIDER_SITE_OTHER): Payer: Medicare Other

## 2011-06-11 DIAGNOSIS — N390 Urinary tract infection, site not specified: Secondary | ICD-10-CM

## 2011-06-11 LAB — URINALYSIS
Ketones, ur: NEGATIVE
Leukocytes, UA: NEGATIVE
Nitrite: NEGATIVE
Specific Gravity, Urine: 1.02 (ref 1.000–1.030)
Urobilinogen, UA: 0.2 (ref 0.0–1.0)

## 2011-06-16 ENCOUNTER — Ambulatory Visit (INDEPENDENT_AMBULATORY_CARE_PROVIDER_SITE_OTHER): Payer: Medicare Other | Admitting: Internal Medicine

## 2011-06-16 ENCOUNTER — Encounter: Payer: Self-pay | Admitting: Internal Medicine

## 2011-06-16 DIAGNOSIS — N390 Urinary tract infection, site not specified: Secondary | ICD-10-CM

## 2011-06-16 DIAGNOSIS — I10 Essential (primary) hypertension: Secondary | ICD-10-CM

## 2011-06-16 MED ORDER — SULFAMETHOXAZOLE-TRIMETHOPRIM 800-160 MG PO TABS: 1.0000 | ORAL_TABLET | Freq: Once | ORAL | Status: DC

## 2011-06-16 NOTE — Progress Notes (Signed)
  Subjective:    Patient ID: [name] [name], male    DOB: [birth year], 77 y.o.   MRN: [id number]  HPI  F/u UTI - no sx's  Review of Systems  Constitutional: Negative for fever and fatigue.  Genitourinary: Negative for dysuria, urgency, flank pain, decreased urine volume and penile swelling.       Objective:   Physical Exam  Constitutional: He appears well-developed. No distress.  HENT:  Head: Normocephalic.  Neck: No JVD present.  Cardiovascular: Exam reveals no gallop and no friction rub.   No murmur heard. Pulmonary/Chest: He has no wheezes.  Abdominal: He exhibits no distension. There is no guarding.  Musculoskeletal: He exhibits no edema.  Neurological:       aphasic  Skin: No rash noted.        Cx reviewed  Assessment & Plan:

## 2011-06-16 NOTE — Assessment & Plan Note (Signed)
On daily abx

## 2011-06-16 NOTE — Assessment & Plan Note (Signed)
On Rx 

## 2011-10-27 ENCOUNTER — Other Ambulatory Visit (INDEPENDENT_AMBULATORY_CARE_PROVIDER_SITE_OTHER): Payer: Medicare Other

## 2011-10-27 ENCOUNTER — Other Ambulatory Visit: Payer: Medicare Other

## 2011-10-27 ENCOUNTER — Ambulatory Visit: Payer: Medicare Other

## 2011-10-27 ENCOUNTER — Other Ambulatory Visit: Payer: Self-pay | Admitting: *Deleted

## 2011-10-27 ENCOUNTER — Telehealth: Payer: Self-pay | Admitting: Internal Medicine

## 2011-10-27 DIAGNOSIS — I10 Essential (primary) hypertension: Secondary | ICD-10-CM

## 2011-10-27 DIAGNOSIS — N390 Urinary tract infection, site not specified: Secondary | ICD-10-CM

## 2011-10-27 DIAGNOSIS — E7800 Pure hypercholesterolemia, unspecified: Secondary | ICD-10-CM

## 2011-10-27 DIAGNOSIS — N289 Disorder of kidney and ureter, unspecified: Secondary | ICD-10-CM

## 2011-10-27 DIAGNOSIS — E039 Hypothyroidism, unspecified: Secondary | ICD-10-CM

## 2011-10-27 LAB — CBC WITH DIFFERENTIAL/PLATELET
Basophils Relative: 0.4 % (ref 0.0–3.0)
HCT: 38.9 % — ABNORMAL LOW (ref 39.0–52.0)
Hemoglobin: 13.3 g/dL (ref 13.0–17.0)
Lymphocytes Relative: 27.5 % (ref 12.0–46.0)
MCHC: 34.1 g/dL (ref 30.0–36.0)
Monocytes Relative: 10.2 % (ref 3.0–12.0)
Neutro Abs: 5.5 10*3/uL (ref 1.4–7.7)
RBC: 3.91 Mil/uL — ABNORMAL LOW (ref 4.22–5.81)

## 2011-10-27 LAB — COMPREHENSIVE METABOLIC PANEL
BUN: 28 mg/dL — ABNORMAL HIGH (ref 6–23)
CO2: 29 mEq/L (ref 19–32)
Calcium: 9.1 mg/dL (ref 8.4–10.5)
Chloride: 103 mEq/L (ref 96–112)
Creatinine, Ser: 1.3 mg/dL (ref 0.4–1.5)
GFR: 53.66 mL/min — ABNORMAL LOW (ref >60.00)

## 2011-10-27 LAB — LIPID PANEL
Cholesterol: 129 mg/dL (ref 0–200)
HDL: 53.9 mg/dL (ref >39.00)
Triglycerides: 104 mg/dL (ref 0.0–149.0)

## 2011-10-27 LAB — URINALYSIS, ROUTINE W REFLEX MICROSCOPIC
Bilirubin Urine: NEGATIVE
Ketones, ur: NEGATIVE
Total Protein, Urine: NEGATIVE
pH: 6.5 (ref 5.0–8.0)

## 2011-10-27 NOTE — Telephone Encounter (Signed)
Pls cx urine Keep ROV Thx

## 2011-10-27 NOTE — Telephone Encounter (Signed)
Urine Cx added.

## 2011-10-29 LAB — URINE CULTURE: Colony Count: 10000

## 2011-10-30 ENCOUNTER — Encounter: Payer: Self-pay | Admitting: Internal Medicine

## 2011-10-30 ENCOUNTER — Ambulatory Visit (INDEPENDENT_AMBULATORY_CARE_PROVIDER_SITE_OTHER): Payer: Medicare Other | Admitting: Internal Medicine

## 2011-10-30 ENCOUNTER — Ambulatory Visit: Payer: Medicare Other | Admitting: Internal Medicine

## 2011-10-30 VITALS — BP 114/66 | HR 70 | Temp 97.5°F | Resp 14 | Ht 66.75 in | Wt 176.0 lb

## 2011-10-30 DIAGNOSIS — I10 Essential (primary) hypertension: Secondary | ICD-10-CM

## 2011-10-30 DIAGNOSIS — R32 Unspecified urinary incontinence: Secondary | ICD-10-CM | POA: Insufficient documentation

## 2011-10-30 DIAGNOSIS — N390 Urinary tract infection, site not specified: Secondary | ICD-10-CM

## 2011-10-30 DIAGNOSIS — Z0000 Encounter for general adult medical examination without abnormal findings: Secondary | ICD-10-CM

## 2011-10-30 DIAGNOSIS — Z136 Encounter for screening for cardiovascular disorders: Secondary | ICD-10-CM

## 2011-10-30 MED ORDER — AMLODIPINE BESYLATE 10 MG PO TABS: 10.0000 mg | ORAL_TABLET | Freq: Every day | ORAL | Status: DC

## 2011-10-30 MED ORDER — DICLOFENAC SODIUM 1.5 % TD SOLN: 3.0000 [drp] | Freq: Three times a day (TID) | TRANSDERMAL | Status: DC | PRN

## 2011-10-30 MED ORDER — TAMSULOSIN HCL 0.4 MG PO CAPS: 0.4000 mg | ORAL_CAPSULE | Freq: Every day | ORAL | Status: DC

## 2011-10-30 MED ORDER — ROSUVASTATIN CALCIUM 10 MG PO TABS: 10.0000 mg | ORAL_TABLET | Freq: Every day | ORAL | Status: DC

## 2011-10-30 MED ORDER — LEVOTHYROXINE SODIUM 100 MCG PO TABS: 100.0000 ug | ORAL_TABLET | Freq: Every day | ORAL | Status: DC

## 2011-10-30 MED ORDER — SULFAMETHOXAZOLE-TRIMETHOPRIM 800-160 MG PO TABS: 1.0000 | ORAL_TABLET | Freq: Once | ORAL | Status: DC

## 2011-10-30 MED ORDER — ATENOLOL-CHLORTHALIDONE 50-25 MG PO TABS: 1.0000 | ORAL_TABLET | Freq: Every day | ORAL | Status: DC

## 2011-10-30 MED ORDER — TRAMADOL HCL 50 MG PO TABS: 50.0000 mg | ORAL_TABLET | Freq: Every day | ORAL | Status: DC | PRN

## 2011-10-30 NOTE — Assessment & Plan Note (Signed)
The patient is here for annual Medicare wellness examination and management of other chronic and acute problems.   The risk factors are reflected in the social history.  The roster of all physicians providing medical care to patient - is listed in the Snapshot section of the chart.  Activities of daily living:  The patient is 100% inedpendent in all ADLs: dressing, toileting, feeding as well as independent mobility  Home safety : The patient has smoke detectors in the home. They wear seatbelts.No firearms at home ( firearms are present in the home, kept in a safe fashion). There is no violence in the home.   There is no risks for hepatitis, STDs or HIV. There is no   history of blood transfusion. They have no travel history to infectious disease endemic areas of the world.  The patient has (has not) seen their dentist in the last six month. They have (not) seen their eye doctor in the last year. They deny (admit to) any hearing difficulty and have not had audiologic testing in the last year.  They do not  have excessive sun exposure. Discussed the need for sun protection: hats, long sleeves and use of sunscreen if there is significant sun exposure.   Diet: the importance of a healthy diet is discussed. They do have a healthy (unhealthy-high fat/fast food) diet.  The patient has a regular exercise program: yard work___5__per week.  The benefits of regular aerobic exercise were discussed.  Depression screen: there are no signs or vegative symptoms of depression- irritability, change in appetite, anhedonia, sadness/tearfullness.  Cognitive assessment: the patient manages all their financial and personal affairs and is actively engaged. They could relate day,date,year and events; recalled 3/3 objects at 3 minutes; performed clock-face test normally.  The following portions of the patient's history were reviewed and updated as appropriate: allergies, current medications, past family history, past  medical history,  past surgical history, past social history  and problem list.  Vision, hearing, body mass index were assessed and reviewed.   During the course of the visit the patient was educated and counseled about appropriate screening and preventive services including : fall prevention , diabetes screening, nutrition counseling, colorectal cancer screening, and recommended immunizations.

## 2011-10-30 NOTE — Assessment & Plan Note (Signed)
Urol cons 

## 2011-10-30 NOTE — Progress Notes (Signed)
  Subjective:    Patient ID: [name] [name], male    DOB: [birth year], 77 y.o.   MRN: [id number]  HPI  The patient is here for a wellness exam. The patient has been doing well overall without major physical or psychological issues going on lately. The patient needs to address   to address chronic  hyperlipidemia controlled with medicines and to address his chronic UTI. C/o loss of bladder control   Review of Systems  Constitutional: Positive for fatigue. Negative for appetite change and unexpected weight change.  HENT: Negative for nosebleeds, congestion, sore throat, sneezing, trouble swallowing and neck pain.   Eyes: Negative for itching and visual disturbance.  Respiratory: Negative for cough.   Cardiovascular: Negative for chest pain, palpitations and leg swelling.  Gastrointestinal: Negative for nausea, diarrhea, blood in stool and abdominal distention.  Genitourinary: Positive for decreased urine volume and difficulty urinating. Negative for frequency and hematuria.       Incontinent sometimes  Musculoskeletal: Negative for back pain, joint swelling and gait problem.  Skin: Negative for rash.  Neurological: Positive for speech difficulty. Negative for dizziness, tremors and weakness.  Psychiatric/Behavioral: Negative for suicidal ideas, sleep disturbance, dysphoric mood and agitation. The patient is not nervous/anxious.        Objective:   Physical Exam  Constitutional: He is oriented to person, place, and time. He appears well-developed and well-nourished. No distress.  HENT:  Head: Normocephalic and atraumatic.  Right Ear: External ear normal.  Left Ear: External ear normal.  Nose: Nose normal.  Mouth/Throat: Oropharynx is clear and moist. No oropharyngeal exudate.  Eyes: Conjunctivae and EOM are normal. Pupils are equal, round, and reactive to light. Right eye exhibits no discharge. Left eye exhibits no discharge. No scleral icterus.  Neck: Normal range of motion. Neck  supple. No JVD present. No tracheal deviation present. No thyromegaly present.  Cardiovascular: Normal rate, regular rhythm, normal heart sounds and intact distal pulses.  Exam reveals no gallop and no friction rub.   No murmur heard. Pulmonary/Chest: Effort normal and breath sounds normal. No stridor. No respiratory distress. He has no wheezes. He has no rales. He exhibits no tenderness.  Abdominal: Soft. Bowel sounds are normal. He exhibits no distension and no mass. There is no tenderness. There is no rebound and no guarding.  Genitourinary: Rectum normal and penis normal. Guaiac negative stool. No penile tenderness.       Prostate 1-2+ w/o nodules  Musculoskeletal: Normal range of motion. He exhibits no edema and no tenderness.       cane  Lymphadenopathy:    He has no cervical adenopathy.  Neurological: He is alert and oriented to person, place, and time. He has normal reflexes. A cranial nerve deficit (aphasic) is present. No sensory deficit. He exhibits normal muscle tone. Coordination and gait abnormal.  Skin: Skin is warm and dry. No rash noted. He is not diaphoretic. No erythema. No pallor.  Psychiatric: He has a normal mood and affect. His behavior is normal. Judgment and thought content normal.          Assessment & Plan:

## 2011-10-30 NOTE — Assessment & Plan Note (Signed)
Urol consult Cont daily Bactrim for now

## 2011-10-30 NOTE — Assessment & Plan Note (Signed)
Continue with current prescription therapy as reflected on the Med list.  

## 2012-04-27 ENCOUNTER — Telehealth: Payer: Self-pay | Admitting: *Deleted

## 2012-04-27 DIAGNOSIS — E7800 Pure hypercholesterolemia, unspecified: Secondary | ICD-10-CM

## 2012-04-27 DIAGNOSIS — E039 Hypothyroidism, unspecified: Secondary | ICD-10-CM

## 2012-04-27 DIAGNOSIS — I10 Essential (primary) hypertension: Secondary | ICD-10-CM

## 2012-04-27 DIAGNOSIS — N289 Disorder of kidney and ureter, unspecified: Secondary | ICD-10-CM

## 2012-04-27 NOTE — Telephone Encounter (Signed)
UA, TSH, Lipids, CMET Thx

## 2012-04-27 NOTE — Telephone Encounter (Signed)
Message copied by Merrilyn Puma on Tue Apr 27, 2012  9:03 AM ------      Message from: Newell Coral      Created: Mon Apr 26, 2012  4:23 PM       The pt called and is hoping to get labs and urine test done before the 6 month follow up on 6/12.  Thanks!

## 2012-04-27 NOTE — Telephone Encounter (Signed)
Please advise what labs.  

## 2012-04-28 NOTE — Telephone Encounter (Signed)
Labs entered. Left detailed mess informing pt.  

## 2012-04-29 ENCOUNTER — Other Ambulatory Visit (INDEPENDENT_AMBULATORY_CARE_PROVIDER_SITE_OTHER): Payer: Medicare Other

## 2012-04-29 DIAGNOSIS — I10 Essential (primary) hypertension: Secondary | ICD-10-CM

## 2012-04-29 DIAGNOSIS — N289 Disorder of kidney and ureter, unspecified: Secondary | ICD-10-CM

## 2012-04-29 DIAGNOSIS — E7800 Pure hypercholesterolemia, unspecified: Secondary | ICD-10-CM

## 2012-04-29 DIAGNOSIS — E039 Hypothyroidism, unspecified: Secondary | ICD-10-CM

## 2012-04-29 LAB — COMPREHENSIVE METABOLIC PANEL
Albumin: 4 g/dL (ref 3.5–5.2)
Alkaline Phosphatase: 48 U/L (ref 39–117)
BUN: 33 mg/dL — ABNORMAL HIGH (ref 6–23)
Glucose, Bld: 71 mg/dL (ref 70–99)
Potassium: 4 mEq/L (ref 3.5–5.1)
Total Bilirubin: 0.6 mg/dL (ref 0.3–1.2)

## 2012-04-29 LAB — URINALYSIS, ROUTINE W REFLEX MICROSCOPIC
Specific Gravity, Urine: 1.015 (ref 1.000–1.030)
Total Protein, Urine: NEGATIVE
Urine Glucose: NEGATIVE
pH: 6 (ref 5.0–8.0)

## 2012-04-29 LAB — LIPID PANEL: Cholesterol: 114 mg/dL (ref 0–200)

## 2012-05-05 ENCOUNTER — Encounter: Payer: Self-pay | Admitting: Internal Medicine

## 2012-05-05 ENCOUNTER — Ambulatory Visit (INDEPENDENT_AMBULATORY_CARE_PROVIDER_SITE_OTHER): Payer: Medicare Other | Admitting: Internal Medicine

## 2012-05-05 VITALS — BP 104/68 | HR 76 | Temp 98.3°F | Resp 16 | Wt 174.0 lb

## 2012-05-05 DIAGNOSIS — M1990 Unspecified osteoarthritis, unspecified site: Secondary | ICD-10-CM

## 2012-05-05 DIAGNOSIS — E039 Hypothyroidism, unspecified: Secondary | ICD-10-CM

## 2012-05-05 DIAGNOSIS — N390 Urinary tract infection, site not specified: Secondary | ICD-10-CM

## 2012-05-05 DIAGNOSIS — I10 Essential (primary) hypertension: Secondary | ICD-10-CM

## 2012-05-05 DIAGNOSIS — N289 Disorder of kidney and ureter, unspecified: Secondary | ICD-10-CM

## 2012-05-05 DIAGNOSIS — Z8679 Personal history of other diseases of the circulatory system: Secondary | ICD-10-CM

## 2012-05-05 MED ORDER — SULFAMETHOXAZOLE-TRIMETHOPRIM 800-160 MG PO TABS: 1.0000 | ORAL_TABLET | Freq: Once | ORAL | Status: DC

## 2012-05-05 NOTE — Assessment & Plan Note (Signed)
Continue with current prescription therapy as reflected on the Med list.  

## 2012-05-05 NOTE — Progress Notes (Signed)
Patient ID: [name] [name], male   DOB: 06/27/1923, 78 y.o.   MRN: [id number]  Subjective:    Patient ID: [name] [name], male    DOB: 06-Jan-1923, 78 y.o.   MRN: [id number]  HPI   The patient needs to address   to address chronic  hyperlipidemia controlled with medicines and to address his chronic UTI. C/o loss of bladder control, near falls sometimes.   Review of Systems  Constitutional: Positive for fatigue. Negative for appetite change and unexpected weight change.  HENT: Negative for nosebleeds, congestion, sore throat, sneezing, trouble swallowing and neck pain.   Eyes: Negative for itching and visual disturbance.  Respiratory: Negative for cough.   Cardiovascular: Negative for chest pain, palpitations and leg swelling.  Gastrointestinal: Negative for nausea, diarrhea, blood in stool and abdominal distention.  Genitourinary: Positive for decreased urine volume and difficulty urinating. Negative for frequency and hematuria.       Incontinent sometimes  Musculoskeletal: Negative for back pain, joint swelling and gait problem.  Skin: Negative for rash.  Neurological: Positive for speech difficulty. Negative for dizziness, tremors and weakness.  Psychiatric/Behavioral: Negative for suicidal ideas, disturbed wake/sleep cycle, dysphoric mood and agitation. The patient is not nervous/anxious.        Objective:   Physical Exam  Constitutional: He is oriented to person, place, and time. He appears well-developed and well-nourished. No distress.  HENT:  Head: Normocephalic and atraumatic.  Right Ear: External ear normal.  Left Ear: External ear normal.  Nose: Nose normal.  Mouth/Throat: Oropharynx is clear and moist. No oropharyngeal exudate.  Eyes: Conjunctivae and EOM are normal. Pupils are equal, round, and reactive to light. Right eye exhibits no discharge. Left eye exhibits no discharge. No scleral icterus.  Neck: Normal range of motion. Neck supple. No JVD present. No tracheal  deviation present. No thyromegaly present.  Cardiovascular: Normal rate, regular rhythm, normal heart sounds and intact distal pulses.  Exam reveals no gallop and no friction rub.   No murmur heard. Pulmonary/Chest: Effort normal and breath sounds normal. No stridor. No respiratory distress. He has no wheezes. He has no rales. He exhibits no tenderness.  Abdominal: Soft. Bowel sounds are normal. He exhibits no distension and no mass. There is no tenderness. There is no rebound and no guarding.  Genitourinary: Rectum normal and penis normal. Guaiac negative stool. No penile tenderness.       Prostate 1-2+ w/o nodules  Musculoskeletal: Normal range of motion. He exhibits no edema and no tenderness.       cane  Lymphadenopathy:    He has no cervical adenopathy.  Neurological: He is alert and oriented to person, place, and time. He has normal reflexes. A cranial nerve deficit (aphasic) is present. No sensory deficit. He exhibits normal muscle tone. Coordination and gait abnormal.  Skin: Skin is warm and dry. No rash noted. He is not diaphoretic. No erythema. No pallor.  Psychiatric: He has a normal mood and affect. His behavior is normal. Judgment and thought content normal.   Lab Results  Component Value Date   WBC 9.1 10/27/2011   HGB 13.3 10/27/2011   HCT 38.9* 10/27/2011   PLT 270.0 10/27/2011   GLUCOSE 71 04/29/2012   CHOL 114 04/29/2012   TRIG 107.0 04/29/2012   HDL 53.20 04/29/2012   LDLCALC 39 04/29/2012   ALT 11 04/29/2012   AST 18 04/29/2012   NA 141 04/29/2012   K 4.0 04/29/2012   CL 103 04/29/2012   CREATININE 1.8*  04/29/2012   BUN 33* 04/29/2012   CO2 29 04/29/2012   TSH 2.81 04/29/2012   PSA 2.37 10/07/2010   HGBA1C 5.6 06/11/2009         Assessment & Plan:

## 2012-05-05 NOTE — Assessment & Plan Note (Signed)
Using a cane 

## 2012-05-05 NOTE — Assessment & Plan Note (Signed)
Chronic. He refused Urol consult 2012 Proteus mirabilis res to Macrobid 12/12 mixed flora 10k colonies 6/13 on Bactrim - another UTI  Continue with current prescription therapy as reflected on the Med list. Urine cx is pending

## 2012-05-05 NOTE — Assessment & Plan Note (Signed)
Monitoring

## 2012-05-05 NOTE — Assessment & Plan Note (Signed)
Aphasic Continue with current prescription therapy as reflected on the Med list.

## 2012-10-29 ENCOUNTER — Other Ambulatory Visit: Payer: Self-pay | Admitting: *Deleted

## 2012-10-29 ENCOUNTER — Other Ambulatory Visit (INDEPENDENT_AMBULATORY_CARE_PROVIDER_SITE_OTHER): Payer: Medicare Other

## 2012-10-29 DIAGNOSIS — N289 Disorder of kidney and ureter, unspecified: Secondary | ICD-10-CM

## 2012-10-29 LAB — BASIC METABOLIC PANEL
GFR: 39.08 mL/min — ABNORMAL LOW (ref >60.00)
Glucose, Bld: 87 mg/dL (ref 70–99)
Potassium: 3.6 mEq/L (ref 3.5–5.1)
Sodium: 139 mEq/L (ref 135–145)

## 2012-10-29 LAB — URINALYSIS, ROUTINE W REFLEX MICROSCOPIC
Ketones, ur: NEGATIVE
Nitrite: POSITIVE
Specific Gravity, Urine: 1.015 (ref 1.000–1.030)
pH: 6 (ref 5.0–8.0)

## 2012-11-03 ENCOUNTER — Ambulatory Visit (INDEPENDENT_AMBULATORY_CARE_PROVIDER_SITE_OTHER): Payer: Medicare Other | Admitting: Internal Medicine

## 2012-11-03 ENCOUNTER — Encounter: Payer: Self-pay | Admitting: Internal Medicine

## 2012-11-03 VITALS — BP 110/62 | HR 80 | Temp 97.5°F | Resp 16 | Wt 170.0 lb

## 2012-11-03 DIAGNOSIS — G20C Parkinsonism, unspecified: Secondary | ICD-10-CM | POA: Insufficient documentation

## 2012-11-03 DIAGNOSIS — N320 Bladder-neck obstruction: Secondary | ICD-10-CM

## 2012-11-03 DIAGNOSIS — E039 Hypothyroidism, unspecified: Secondary | ICD-10-CM

## 2012-11-03 DIAGNOSIS — I10 Essential (primary) hypertension: Secondary | ICD-10-CM

## 2012-11-03 DIAGNOSIS — Z0000 Encounter for general adult medical examination without abnormal findings: Secondary | ICD-10-CM

## 2012-11-03 DIAGNOSIS — Z8679 Personal history of other diseases of the circulatory system: Secondary | ICD-10-CM

## 2012-11-03 DIAGNOSIS — N390 Urinary tract infection, site not specified: Secondary | ICD-10-CM

## 2012-11-03 DIAGNOSIS — N400 Enlarged prostate without lower urinary tract symptoms: Secondary | ICD-10-CM

## 2012-11-03 DIAGNOSIS — Z136 Encounter for screening for cardiovascular disorders: Secondary | ICD-10-CM

## 2012-11-03 DIAGNOSIS — Z23 Encounter for immunization: Secondary | ICD-10-CM

## 2012-11-03 DIAGNOSIS — G20 Parkinson's disease: Secondary | ICD-10-CM

## 2012-11-03 MED ORDER — FINASTERIDE 5 MG PO TABS: 5.0000 mg | ORAL_TABLET | Freq: Every day | ORAL | Status: DC

## 2012-11-03 MED ORDER — ROSUVASTATIN CALCIUM 10 MG PO TABS: 10.0000 mg | ORAL_TABLET | Freq: Every day | ORAL | Status: DC

## 2012-11-03 MED ORDER — ATENOLOL-CHLORTHALIDONE 50-25 MG PO TABS: 1.0000 | ORAL_TABLET | Freq: Every day | ORAL | Status: DC

## 2012-11-03 MED ORDER — SULFAMETHOXAZOLE-TRIMETHOPRIM 800-160 MG PO TABS: 1.0000 | ORAL_TABLET | Freq: Once | ORAL | Status: DC

## 2012-11-03 MED ORDER — TAMSULOSIN HCL 0.4 MG PO CAPS: 0.4000 mg | ORAL_CAPSULE | Freq: Every day | ORAL | Status: DC

## 2012-11-03 MED ORDER — LEVOTHYROXINE SODIUM 100 MCG PO TABS: 100.0000 ug | ORAL_TABLET | Freq: Every day | ORAL | Status: DC

## 2012-11-03 MED ORDER — CARBIDOPA-LEVODOPA 25-100 MG PO TABS: 1.0000 | ORAL_TABLET | Freq: Three times a day (TID) | ORAL | Status: DC

## 2012-11-03 MED ORDER — AMLODIPINE BESYLATE 10 MG PO TABS: 10.0000 mg | ORAL_TABLET | Freq: Every day | ORAL | Status: DC

## 2012-11-03 NOTE — Assessment & Plan Note (Signed)
Better on Bactim

## 2012-11-03 NOTE — Assessment & Plan Note (Signed)
The patient is here for annual Medicare wellness examination and management of other chronic and acute problems.   The risk factors are reflected in the social history.  The roster of all physicians providing medical care to patient - is listed in the Snapshot section of the chart.  Activities of daily living:  The patient is 100% inedpendent in all ADLs: dressing, toileting, feeding as well as independent mobility  Home safety : The patient has smoke detectors in the home. They wear seatbelts.No firearms at home ( firearms are present in the home, kept in a safe fashion). There is no violence in the home.   There is no risks for hepatitis, STDs or HIV. There is no   history of blood transfusion. They have no travel history to infectious disease endemic areas of the world.  The patient has (has not) seen their dentist in the last six month. They have (not) seen their eye doctor in the last year. They deny (admit to) any hearing difficulty and have not had audiologic testing in the last year.  They do not  have excessive sun exposure. Discussed the need for sun protection: hats, long sleeves and use of sunscreen if there is significant sun exposure.   Diet: the importance of a healthy diet is discussed. They do have a healthy (unhealthy-high fat/fast food) diet.  The patient has a regular exercise program: yard work___5__per week.  The benefits of regular aerobic exercise were discussed.  Depression screen: there are no signs or vegative symptoms of depression- irritability, change in appetite, anhedonia, sadness/tearfullness.  Cognitive assessment: the patient manages all their financial and personal affairs and is actively engaged. They could relate day,date,year and events; recalled 3/3 objects at 3 minutes; performed clock-face test normally.  The following portions of the patient's history were reviewed and updated as appropriate: allergies, current medications, past family history, past  medical history,  past surgical history, past social history  and problem list.  Vision, hearing - decreased, body mass index were assessed and reviewed.   During the course of the visit the patient was educated and counseled about appropriate screening and preventive services including : fall prevention , diabetes screening, nutrition counseling, colorectal cancer screening, and recommended immunizations.

## 2012-11-03 NOTE — Assessment & Plan Note (Signed)
Will try sinemet

## 2012-11-03 NOTE — Progress Notes (Signed)
Subjective:     HPI  The patient is here for a wellness exam. The patient has been doing well overall with more tremors going on lately. The patient needs to address   to address chronic  hyperlipidemia controlled with medicines and to address his chronic UTI/BPH/incontinence. C/o loss of bladder control   Review of Systems  Constitutional: Positive for fatigue. Negative for appetite change and unexpected weight change.  HENT: Negative for nosebleeds, congestion, sore throat, sneezing, trouble swallowing and neck pain.   Eyes: Negative for itching and visual disturbance.  Respiratory: Negative for cough.   Cardiovascular: Negative for chest pain, palpitations and leg swelling.  Gastrointestinal: Negative for nausea, diarrhea, blood in stool and abdominal distention.  Genitourinary: Positive for decreased urine volume and difficulty urinating. Negative for frequency and hematuria.       Incontinent sometimes  Musculoskeletal: Negative for back pain, joint swelling and gait problem.  Skin: Negative for rash.  Neurological: Positive for speech difficulty. Negative for dizziness, tremors and weakness.  Psychiatric/Behavioral: Negative for suicidal ideas, sleep disturbance, dysphoric mood and agitation. The patient is not nervous/anxious.        Objective:   Physical Exam  Constitutional: He is oriented to person, place, and time. He appears well-developed and well-nourished. No distress.  HENT:  Head: Normocephalic and atraumatic.  Right Ear: External ear normal.  Left Ear: External ear normal.  Nose: Nose normal.  Mouth/Throat: Oropharynx is clear and moist. No oropharyngeal exudate.  Eyes: Conjunctivae normal and EOM are normal. Pupils are equal, round, and reactive to light. Right eye exhibits no discharge. Left eye exhibits no discharge. No scleral icterus.  Neck: Normal range of motion. Neck supple. No JVD present. No tracheal deviation present. No thyromegaly present.   Cardiovascular: Normal rate, regular rhythm, normal heart sounds and intact distal pulses.  Exam reveals no gallop and no friction rub.   No murmur heard. Pulmonary/Chest: Effort normal and breath sounds normal. No stridor. No respiratory distress. He has no wheezes. He has no rales. He exhibits no tenderness.  Abdominal: Soft. Bowel sounds are normal. He exhibits no distension and no mass. There is no tenderness. There is no rebound and no guarding.  Genitourinary: Rectum normal and penis normal. Guaiac negative stool. No penile tenderness.       Prostate 1-2+ w/o nodules  Musculoskeletal: Normal range of motion. He exhibits no edema and no tenderness.       cane  Lymphadenopathy:    He has no cervical adenopathy.  Neurological: He is alert and oriented to person, place, and time. He has normal reflexes. A cranial nerve deficit (aphasic) is present. No sensory deficit. He exhibits normal muscle tone. Coordination and gait abnormal.  Skin: Skin is warm and dry. No rash noted. He is not diaphoretic. No erythema. No pallor.  Psychiatric: He has a normal mood and affect. His behavior is normal. Judgment and thought content normal.   Lab Results  Component Value Date   WBC 9.1 10/27/2011   HGB 13.3 10/27/2011   HCT 38.9* 10/27/2011   PLT 270.0 10/27/2011   GLUCOSE 87 10/29/2012   CHOL 114 04/29/2012   TRIG 107.0 04/29/2012   HDL 53.20 04/29/2012   LDLCALC 39 04/29/2012   ALT 11 04/29/2012   AST 18 04/29/2012   NA 139 10/29/2012   K 3.6 10/29/2012   CL 101 10/29/2012   CREATININE 1.8* 10/29/2012   BUN 31* 10/29/2012   CO2 29 10/29/2012   TSH 2.81 04/29/2012  PSA 2.37 10/07/2010   HGBA1C 5.6 06/11/2009           Assessment & Plan:

## 2012-11-03 NOTE — Assessment & Plan Note (Signed)
Continue with current prescription therapy as reflected on the Med list.  

## 2012-11-03 NOTE — Assessment & Plan Note (Signed)
Urol consult Start Proscar

## 2013-02-02 ENCOUNTER — Other Ambulatory Visit (INDEPENDENT_AMBULATORY_CARE_PROVIDER_SITE_OTHER): Payer: Medicare Other

## 2013-02-02 ENCOUNTER — Ambulatory Visit (INDEPENDENT_AMBULATORY_CARE_PROVIDER_SITE_OTHER): Payer: Medicare Other | Admitting: Internal Medicine

## 2013-02-02 ENCOUNTER — Encounter: Payer: Self-pay | Admitting: Internal Medicine

## 2013-02-02 VITALS — BP 110/60 | HR 80 | Temp 97.5°F | Resp 16 | Wt 173.0 lb

## 2013-02-02 DIAGNOSIS — N320 Bladder-neck obstruction: Secondary | ICD-10-CM

## 2013-02-02 DIAGNOSIS — N390 Urinary tract infection, site not specified: Secondary | ICD-10-CM

## 2013-02-02 DIAGNOSIS — E039 Hypothyroidism, unspecified: Secondary | ICD-10-CM

## 2013-02-02 DIAGNOSIS — G20 Parkinson's disease: Secondary | ICD-10-CM

## 2013-02-02 DIAGNOSIS — N400 Enlarged prostate without lower urinary tract symptoms: Secondary | ICD-10-CM

## 2013-02-02 DIAGNOSIS — I10 Essential (primary) hypertension: Secondary | ICD-10-CM

## 2013-02-02 DIAGNOSIS — Z0000 Encounter for general adult medical examination without abnormal findings: Secondary | ICD-10-CM

## 2013-02-02 DIAGNOSIS — N289 Disorder of kidney and ureter, unspecified: Secondary | ICD-10-CM

## 2013-02-02 DIAGNOSIS — Z136 Encounter for screening for cardiovascular disorders: Secondary | ICD-10-CM

## 2013-02-02 LAB — BASIC METABOLIC PANEL
BUN: 45 mg/dL — ABNORMAL HIGH (ref 6–23)
Chloride: 106 mEq/L (ref 96–112)
Potassium: 4 mEq/L (ref 3.5–5.1)
Sodium: 141 mEq/L (ref 135–145)

## 2013-02-02 LAB — LIPID PANEL
Cholesterol: 121 mg/dL (ref 0–200)
LDL Cholesterol: 44 mg/dL (ref 0–99)
Total CHOL/HDL Ratio: 2
VLDL: 26.2 mg/dL (ref 0.0–40.0)

## 2013-02-02 LAB — URINALYSIS, ROUTINE W REFLEX MICROSCOPIC
Bilirubin Urine: NEGATIVE
Nitrite: POSITIVE
pH: 6 (ref 5.0–8.0)

## 2013-02-02 LAB — HEPATIC FUNCTION PANEL
ALT: 14 U/L (ref 0–53)
AST: 18 U/L (ref 0–37)
Alkaline Phosphatase: 51 U/L (ref 39–117)
Bilirubin, Direct: 0.1 mg/dL (ref 0.0–0.3)
Total Bilirubin: 0.6 mg/dL (ref 0.3–1.2)

## 2013-02-02 LAB — TSH: TSH: 2.8 u[IU]/mL (ref 0.35–5.50)

## 2013-02-02 NOTE — Progress Notes (Signed)
Subjective:     HPI C/o more tremors going on lately. Hasen't started the meds yet The patient needs to address   to address chronic  hyperlipidemia controlled with medicines and to address his chronic UTI/BPH/incontinence. C/o loss of bladder control  BP Readings from Last 3 Encounters:  02/02/13 110/60  11/03/12 110/62  05/05/12 104/68   BP Readings from Last 3 Encounters:  02/02/13 110/60  11/03/12 110/62  05/05/12 104/68      Review of Systems  Constitutional: Positive for fatigue. Negative for appetite change and unexpected weight change.  HENT: Negative for nosebleeds, congestion, sore throat, sneezing, trouble swallowing and neck pain.   Eyes: Negative for itching and visual disturbance.  Respiratory: Negative for cough.   Cardiovascular: Negative for chest pain, palpitations and leg swelling.  Gastrointestinal: Negative for nausea, diarrhea, blood in stool and abdominal distention.  Genitourinary: Positive for decreased urine volume and difficulty urinating. Negative for frequency and hematuria.       Incontinent sometimes  Musculoskeletal: Negative for back pain, joint swelling and gait problem.  Skin: Negative for rash.  Neurological: Positive for speech difficulty. Negative for dizziness, tremors and weakness.  Psychiatric/Behavioral: Negative for suicidal ideas, sleep disturbance, dysphoric mood and agitation. The patient is not nervous/anxious.        Objective:   Physical Exam  Constitutional: He is oriented to person, place, and time. He appears well-developed and well-nourished. No distress.  HENT:  Head: Normocephalic and atraumatic.  Right Ear: External ear normal.  Left Ear: External ear normal.  Nose: Nose normal.  Mouth/Throat: Oropharynx is clear and moist. No oropharyngeal exudate.  Eyes: Conjunctivae and EOM are normal. Pupils are equal, round, and reactive to light. Right eye exhibits no discharge. Left eye exhibits no discharge. No scleral  icterus.  Neck: Normal range of motion. Neck supple. No JVD present. No tracheal deviation present. No thyromegaly present.  Cardiovascular: Normal rate, regular rhythm, normal heart sounds and intact distal pulses.  Exam reveals no gallop and no friction rub.   No murmur heard. Pulmonary/Chest: Effort normal and breath sounds normal. No stridor. No respiratory distress. He has no wheezes. He has no rales. He exhibits no tenderness.  Abdominal: Soft. Bowel sounds are normal. He exhibits no distension and no mass. There is no tenderness. There is no rebound and no guarding.  Genitourinary: Rectum normal and penis normal. Guaiac negative stool. No penile tenderness.  Prostate 1-2+ w/o nodules  Musculoskeletal: Normal range of motion. He exhibits no edema and no tenderness.  cane  Lymphadenopathy:    He has no cervical adenopathy.  Neurological: He is alert and oriented to person, place, and time. He has normal reflexes. A cranial nerve deficit (aphasic) is present. No sensory deficit. He exhibits normal muscle tone. Coordination and gait abnormal.  Skin: Skin is warm and dry. No rash noted. He is not diaphoretic. No erythema. No pallor.  Psychiatric: He has a normal mood and affect. His behavior is normal. Judgment and thought content normal.  He is in a w/c today  Lab Results  Component Value Date   WBC 9.1 10/27/2011   HGB 13.3 10/27/2011   HCT 38.9* 10/27/2011   PLT 270.0 10/27/2011   GLUCOSE 87 10/29/2012   CHOL 114 04/29/2012   TRIG 107.0 04/29/2012   HDL 53.20 04/29/2012   LDLCALC 39 04/29/2012   ALT 11 04/29/2012   AST 18 04/29/2012   NA 139 10/29/2012   K 3.6 10/29/2012   CL 101 10/29/2012  CREATININE 1.8* 10/29/2012   BUN 31* 10/29/2012   CO2 29 10/29/2012   TSH 2.81 04/29/2012   PSA 2.37 10/07/2010   HGBA1C 5.6 06/11/2009           Assessment & Plan:

## 2013-02-02 NOTE — Assessment & Plan Note (Signed)
TSH Continue with current prescription therapy as reflected on the Med list.  

## 2013-02-02 NOTE — Patient Instructions (Addendum)
Can use Robutussin DM as needed

## 2013-02-02 NOTE — Assessment & Plan Note (Signed)
Monitor BMET. 

## 2013-02-02 NOTE — Assessment & Plan Note (Signed)
s/p Urol eval

## 2013-02-02 NOTE — Assessment & Plan Note (Signed)
S/p Urol ref

## 2013-02-02 NOTE — Assessment & Plan Note (Signed)
Discussed - ok to try Sinemet

## 2013-02-02 NOTE — Assessment & Plan Note (Signed)
Controlled Continue with current prescription therapy as reflected on the Med list.

## 2013-02-03 ENCOUNTER — Telehealth: Payer: Self-pay | Admitting: Internal Medicine

## 2013-02-03 NOTE — Telephone Encounter (Signed)
[name] [name], please, inform patient that all labs are normal except for an abn kidney tests and abn UA. Keep appt w/Dr Retta Diones this Fri Thx

## 2013-02-03 NOTE — Telephone Encounter (Signed)
Left detailed mess informing pt of below.  

## 2013-05-16 ENCOUNTER — Telehealth: Payer: Self-pay | Admitting: Internal Medicine

## 2013-05-16 NOTE — Telephone Encounter (Signed)
[name] [name] calling to say that medication for Parkinson's seems to make [name] [name] sleep all the time.  Can he take something else that makes him less drowsy?

## 2013-05-16 NOTE — Telephone Encounter (Signed)
Drowsiness can be a side effect of the sinemet. I would recommend taking one tablet only at bedtime for 2-3 weeks until he becomes adjusted to the medication, then increase to twice daily. If continued sleeping side effects after 2 weeks on this reduced dose of the medication, stop the medication and schedule ROV with Dr Posey Rea to consider other therapy if needed

## 2013-05-17 NOTE — Telephone Encounter (Signed)
Called [name] [name] no answer LMOM RTC...Raechel Chute

## 2013-05-18 ENCOUNTER — Telehealth: Payer: Self-pay | Admitting: *Deleted

## 2013-05-18 NOTE — Telephone Encounter (Signed)
[name] [name], pts wife called stating the pt has been excessively drowsy and is wondering if the Carbidopa Levodopa 25-100mg  is the cause.  Please advise

## 2013-05-18 NOTE — Telephone Encounter (Signed)
Wife finally return called back gave her md response. She states he was only taking 1 tab in the morning. Advise her to have him to take it at night and see how that work for him. If still causing drowsiness will f/u with Dr. Barnetta Chapel...lmb

## 2013-05-18 NOTE — Telephone Encounter (Signed)
See previous msg been trying to call pt with md response. Called pt back still no answer LMOM RTC...lmb

## 2013-05-18 NOTE — Telephone Encounter (Signed)
Tried calling again still no answer or return call back from previous msg. Closing note...lmb

## 2013-06-08 ENCOUNTER — Encounter: Payer: Self-pay | Admitting: Internal Medicine

## 2013-06-08 ENCOUNTER — Other Ambulatory Visit (INDEPENDENT_AMBULATORY_CARE_PROVIDER_SITE_OTHER): Payer: Medicare Other

## 2013-06-08 ENCOUNTER — Ambulatory Visit (INDEPENDENT_AMBULATORY_CARE_PROVIDER_SITE_OTHER): Payer: Medicare Other | Admitting: Internal Medicine

## 2013-06-08 VITALS — BP 112/68 | HR 64 | Temp 98.1°F | Resp 16 | Wt 164.5 lb

## 2013-06-08 DIAGNOSIS — Z8679 Personal history of other diseases of the circulatory system: Secondary | ICD-10-CM

## 2013-06-08 DIAGNOSIS — N289 Disorder of kidney and ureter, unspecified: Secondary | ICD-10-CM

## 2013-06-08 DIAGNOSIS — G20 Parkinson's disease: Secondary | ICD-10-CM

## 2013-06-08 DIAGNOSIS — N390 Urinary tract infection, site not specified: Secondary | ICD-10-CM

## 2013-06-08 DIAGNOSIS — G20A1 Parkinson's disease without dyskinesia, without mention of fluctuations: Secondary | ICD-10-CM

## 2013-06-08 DIAGNOSIS — I10 Essential (primary) hypertension: Secondary | ICD-10-CM

## 2013-06-08 LAB — URINALYSIS, ROUTINE W REFLEX MICROSCOPIC
Specific Gravity, Urine: 1.015 (ref 1.000–1.030)
Urobilinogen, UA: 0.2 (ref 0.0–1.0)

## 2013-06-08 LAB — CBC WITH DIFFERENTIAL/PLATELET
Basophils Relative: 0.2 % (ref 0.0–3.0)
Eosinophils Relative: 1 % (ref 0.0–5.0)
HCT: 39.8 % (ref 39.0–52.0)
Lymphs Abs: 2.5 10*3/uL (ref 0.7–4.0)
MCV: 96.1 fl (ref 78.0–100.0)
Monocytes Absolute: 1 10*3/uL (ref 0.1–1.0)
RBC: 4.14 Mil/uL — ABNORMAL LOW (ref 4.22–5.81)
WBC: 10.6 10*3/uL — ABNORMAL HIGH (ref 4.5–10.5)

## 2013-06-08 LAB — BASIC METABOLIC PANEL
BUN: 30 mg/dL — ABNORMAL HIGH (ref 6–23)
CO2: 29 mEq/L (ref 19–32)
Chloride: 102 mEq/L (ref 96–112)
Creatinine, Ser: 1.3 mg/dL (ref 0.4–1.5)

## 2013-06-08 NOTE — Assessment & Plan Note (Signed)
Continue with current prescription therapy as reflected on the Med list.  

## 2013-06-08 NOTE — Assessment & Plan Note (Signed)
Labs

## 2013-06-08 NOTE — Assessment & Plan Note (Signed)
UA Urine cx

## 2013-06-08 NOTE — Progress Notes (Signed)
Subjective:     HPI F/u more tremors going on lately. Sinemet made him sleepy The patient needs to address   to address chronic  hyperlipidemia controlled with medicines and to address his chronic UTI/BPH/incontinence. C/o loss of bladder control; urine w/odor  BP Readings from Last 3 Encounters:  02/02/13 110/60  11/03/12 110/62  05/05/12 104/68   BP Readings from Last 3 Encounters:  02/02/13 110/60  11/03/12 110/62  05/05/12 104/68      Review of Systems  Constitutional: Positive for fatigue. Negative for appetite change and unexpected weight change.  HENT: Negative for nosebleeds, congestion, sore throat, sneezing, trouble swallowing and neck pain.   Eyes: Negative for itching and visual disturbance.  Respiratory: Negative for cough.   Cardiovascular: Negative for chest pain, palpitations and leg swelling.  Gastrointestinal: Negative for nausea, diarrhea, blood in stool and abdominal distention.  Genitourinary: Positive for decreased urine volume and difficulty urinating. Negative for frequency and hematuria.       Incontinent sometimes  Musculoskeletal: Negative for back pain, joint swelling and gait problem.  Skin: Negative for rash.  Neurological: Positive for speech difficulty. Negative for dizziness, tremors and weakness.  Psychiatric/Behavioral: Negative for suicidal ideas, sleep disturbance, dysphoric mood and agitation. The patient is not nervous/anxious.        Objective:   Physical Exam  Constitutional: He is oriented to person, place, and time. He appears well-developed and well-nourished. No distress.  HENT:  Head: Normocephalic and atraumatic.  Right Ear: External ear normal.  Left Ear: External ear normal.  Nose: Nose normal.  Mouth/Throat: Oropharynx is clear and moist. No oropharyngeal exudate.  Eyes: Conjunctivae and EOM are normal. Pupils are equal, round, and reactive to light. Right eye exhibits no discharge. Left eye exhibits no discharge. No  scleral icterus.  Neck: Normal range of motion. Neck supple. No JVD present. No tracheal deviation present. No thyromegaly present.  Cardiovascular: Normal rate, regular rhythm, normal heart sounds and intact distal pulses.  Exam reveals no gallop and no friction rub.   No murmur heard. Pulmonary/Chest: Effort normal and breath sounds normal. No stridor. No respiratory distress. He has no wheezes. He has no rales. He exhibits no tenderness.  Abdominal: Soft. Bowel sounds are normal. He exhibits no distension and no mass. There is no tenderness. There is no rebound and no guarding.  Genitourinary: Rectum normal and penis normal. Guaiac negative stool. No penile tenderness.  Prostate 1-2+ w/o nodules  Musculoskeletal: Normal range of motion. He exhibits no edema and no tenderness.  cane  Lymphadenopathy:    He has no cervical adenopathy.  Neurological: He is alert and oriented to person, place, and time. He has normal reflexes. A cranial nerve deficit (aphasic) is present. No sensory deficit. He exhibits normal muscle tone. Coordination and gait abnormal.  Skin: Skin is warm and dry. No rash noted. He is not diaphoretic. No erythema. No pallor.  Psychiatric: He has a normal mood and affect. His behavior is normal. Judgment and thought content normal.  He is in a w/c today  Lab Results  Component Value Date   WBC 9.1 10/27/2011   HGB 13.3 10/27/2011   HCT 38.9* 10/27/2011   PLT 270.0 10/27/2011   GLUCOSE 87 10/29/2012   CHOL 114 04/29/2012   TRIG 107.0 04/29/2012   HDL 53.20 04/29/2012   LDLCALC 39 04/29/2012   ALT 11 04/29/2012   AST 18 04/29/2012   NA 139 10/29/2012   K 3.6 10/29/2012   CL 101  10/29/2012   CREATININE 1.8* 10/29/2012   BUN 31* 10/29/2012   CO2 29 10/29/2012   TSH 2.81 04/29/2012   PSA 2.37 10/07/2010   HGBA1C 5.6 06/11/2009           Assessment & Plan:

## 2013-06-08 NOTE — Assessment & Plan Note (Signed)
D/c Sinemet

## 2013-06-11 ENCOUNTER — Other Ambulatory Visit: Payer: Self-pay | Admitting: Internal Medicine

## 2013-06-11 LAB — URINE CULTURE

## 2013-06-11 MED ORDER — CIPROFLOXACIN HCL 500 MG PO TABS: 500.0000 mg | ORAL_TABLET | Freq: Every day | ORAL | Status: DC

## 2013-06-11 NOTE — Assessment & Plan Note (Signed)
ENTEROBACTER AEROGENES CEFAZOLIN >=64 R Final CEFEPIME <=1 S Final CEFOXITIN >=64 R Final CEFTAZIDIME <=1 S Final CEFTRIAXONE <=1 S Final CIPROFLOXACIN <=0.25 S Final GENTAMICIN <=1 S Final IMIPENEM 0.5 S Final LEVOFLOXACIN <=0.12 S Final NITROFURANTOIN 128 R Final PIP/TAZO <=4 S Final TOBRAMYCIN <=1 S Final TRIMETH/SULFA >=320 R Final   Cipro x 2 wks

## 2013-06-13 ENCOUNTER — Other Ambulatory Visit: Payer: Self-pay | Admitting: *Deleted

## 2013-06-13 MED ORDER — CIPROFLOXACIN HCL 500 MG PO TABS: 500.0000 mg | ORAL_TABLET | Freq: Every day | ORAL | Status: DC

## 2013-10-13 ENCOUNTER — Other Ambulatory Visit (INDEPENDENT_AMBULATORY_CARE_PROVIDER_SITE_OTHER): Payer: Medicare Other

## 2013-10-13 ENCOUNTER — Ambulatory Visit (INDEPENDENT_AMBULATORY_CARE_PROVIDER_SITE_OTHER): Payer: Medicare Other | Admitting: Internal Medicine

## 2013-10-13 ENCOUNTER — Encounter: Payer: Self-pay | Admitting: Internal Medicine

## 2013-10-13 ENCOUNTER — Ambulatory Visit (INDEPENDENT_AMBULATORY_CARE_PROVIDER_SITE_OTHER)
Admission: RE | Admit: 2013-10-13 | Discharge: 2013-10-13 | Disposition: A | Discharge status: Home / Self Care | Payer: Medicare Other | Source: Ambulatory Visit | Attending: Internal Medicine | Admitting: Internal Medicine

## 2013-10-13 VITALS — BP 110/80 | HR 80 | Temp 98.2°F | Resp 16 | Wt 162.0 lb

## 2013-10-13 DIAGNOSIS — R05 Cough: Secondary | ICD-10-CM

## 2013-10-13 DIAGNOSIS — G20 Parkinson's disease: Secondary | ICD-10-CM

## 2013-10-13 DIAGNOSIS — R269 Unspecified abnormalities of gait and mobility: Secondary | ICD-10-CM

## 2013-10-13 DIAGNOSIS — E039 Hypothyroidism, unspecified: Secondary | ICD-10-CM

## 2013-10-13 DIAGNOSIS — I10 Essential (primary) hypertension: Secondary | ICD-10-CM

## 2013-10-13 DIAGNOSIS — N289 Disorder of kidney and ureter, unspecified: Secondary | ICD-10-CM

## 2013-10-13 DIAGNOSIS — L8990 Pressure ulcer of unspecified site, unspecified stage: Secondary | ICD-10-CM

## 2013-10-13 DIAGNOSIS — L8995 Pressure ulcer of unspecified site, unstageable: Secondary | ICD-10-CM

## 2013-10-13 DIAGNOSIS — R059 Cough, unspecified: Secondary | ICD-10-CM

## 2013-10-13 LAB — HEPATIC FUNCTION PANEL
AST: 16 U/L (ref 0–37)
Albumin: 3.6 g/dL (ref 3.5–5.2)
Bilirubin, Direct: 0.1 mg/dL (ref 0.0–0.3)
Total Bilirubin: 0.6 mg/dL (ref 0.3–1.2)

## 2013-10-13 LAB — TSH: TSH: 2.6 u[IU]/mL (ref 0.35–5.50)

## 2013-10-13 LAB — BASIC METABOLIC PANEL
BUN: 30 mg/dL — ABNORMAL HIGH (ref 6–23)
CO2: 30 mEq/L (ref 19–32)
Chloride: 103 mEq/L (ref 96–112)
GFR: 56.83 mL/min — ABNORMAL LOW (ref >60.00)
Potassium: 4 mEq/L (ref 3.5–5.1)
Sodium: 140 mEq/L (ref 135–145)

## 2013-10-13 LAB — CBC WITH DIFFERENTIAL/PLATELET
Basophils Absolute: 0 10*3/uL (ref 0.0–0.1)
Basophils Relative: 0.3 % (ref 0.0–3.0)
Eosinophils Relative: 1.3 % (ref 0.0–5.0)
HCT: 41.7 % (ref 39.0–52.0)
Lymphs Abs: 2.3 10*3/uL (ref 0.7–4.0)
MCV: 94.9 fl (ref 78.0–100.0)
Monocytes Absolute: 1.2 10*3/uL — ABNORMAL HIGH (ref 0.1–1.0)
Monocytes Relative: 10.6 % (ref 3.0–12.0)
Neutrophils Relative %: 67.4 % (ref 43.0–77.0)
RBC: 4.39 Mil/uL (ref 4.22–5.81)
WBC: 11 10*3/uL — ABNORMAL HIGH (ref 4.5–10.5)

## 2013-10-13 MED ORDER — FINASTERIDE 5 MG PO TABS: 5.0000 mg | ORAL_TABLET | Freq: Every day | ORAL | Status: DC

## 2013-10-13 MED ORDER — LEVOTHYROXINE SODIUM 100 MCG PO TABS: 100.0000 ug | ORAL_TABLET | Freq: Every day | ORAL | Status: DC

## 2013-10-13 MED ORDER — ATENOLOL-CHLORTHALIDONE 50-25 MG PO TABS: 1.0000 | ORAL_TABLET | Freq: Every day | ORAL | Status: DC

## 2013-10-13 MED ORDER — ROSUVASTATIN CALCIUM 10 MG PO TABS: 10.0000 mg | ORAL_TABLET | Freq: Every day | ORAL | Status: DC

## 2013-10-13 MED ORDER — PETROLATUM-ZINC OXIDE 28.5-9.14 % EX OINT: TOPICAL_OINTMENT | CUTANEOUS | Status: DC

## 2013-10-13 MED ORDER — TAMSULOSIN HCL 0.4 MG PO CAPS: 0.4000 mg | ORAL_CAPSULE | Freq: Every day | ORAL | Status: AC

## 2013-10-13 MED ORDER — AMLODIPINE BESYLATE 10 MG PO TABS: 10.0000 mg | ORAL_TABLET | Freq: Every day | ORAL | Status: DC

## 2013-10-13 NOTE — Progress Notes (Signed)
Pre visit review using our clinic review tool, if applicable. No additional management support is needed unless otherwise documented below in the visit note. 

## 2013-10-13 NOTE — Assessment & Plan Note (Signed)
Labs

## 2013-10-13 NOTE — Assessment & Plan Note (Signed)
Labs  Continue with current prescription therapy as reflected on the Med list.  

## 2013-10-13 NOTE — Addendum Note (Signed)
Addended by: Tresa Garter on: 10/13/2013 12:44 PM   Modules accepted: Orders

## 2013-10-13 NOTE — Assessment & Plan Note (Signed)
CXR

## 2013-10-13 NOTE — Assessment & Plan Note (Signed)
Continue with current prescription therapy as reflected on the Med list.  

## 2013-10-13 NOTE — Progress Notes (Signed)
 Subjective:     HPI  F/u more tremors going on lately. Sinemet made him sleepy. Unable to walk x 1.5 months. Using a w/c. The patient needs to address   to address chronic  hyperlipidemia controlled with medicines and to address his chronic UTI/BPH/incontinence. C/o loss of bladder control; urine w/odor  BP Readings from Last 3 Encounters:  02/02/13 110/60  11/03/12 110/62  05/05/12 104/68   BP Readings from Last 3 Encounters:  02/02/13 110/60  11/03/12 110/62  05/05/12 104/68      Review of Systems  Constitutional: Positive for fatigue. Negative for appetite change and unexpected weight change.  HENT: Negative for nosebleeds, congestion, sore throat, sneezing, trouble swallowing and neck pain.   Eyes: Negative for itching and visual disturbance.  Respiratory: Negative for cough.   Cardiovascular: Negative for chest pain, palpitations and leg swelling.  Gastrointestinal: Negative for nausea, diarrhea, blood in stool and abdominal distention.  Genitourinary: Positive for decreased urine volume and difficulty urinating. Negative for frequency and hematuria.       Incontinent sometimes  Musculoskeletal: Negative for back pain, joint swelling and gait problem.  Skin: Negative for rash.  Neurological: Positive for speech difficulty. Negative for dizziness, tremors and weakness.  Psychiatric/Behavioral: Negative for suicidal ideas, sleep disturbance, dysphoric mood and agitation. The patient is not nervous/anxious.        Objective:   Physical Exam  Constitutional: He is oriented to person, place, and time. He appears well-developed and well-nourished. No distress.  HENT:  Head: Normocephalic and atraumatic.  Right Ear: External ear normal.  Left Ear: External ear normal.  Nose: Nose normal.  Mouth/Throat: Oropharynx is clear and moist. No oropharyngeal exudate.  Eyes: Conjunctivae and EOM are normal. Pupils are equal, round, and reactive to light. Right eye exhibits no  discharge. Left eye exhibits no discharge. No scleral icterus.  Neck: Normal range of motion. Neck supple. No JVD present. No tracheal deviation present. No thyromegaly present.  Cardiovascular: Normal rate, regular rhythm, normal heart sounds and intact distal pulses.  Exam reveals no gallop and no friction rub.   No murmur heard. Pulmonary/Chest: Effort normal and breath sounds normal. No stridor. No respiratory distress. He has no wheezes. He has no rales. He exhibits no tenderness.  Abdominal: Soft. Bowel sounds are normal. He exhibits no distension and no mass. There is no tenderness. There is no rebound and no guarding.  Genitourinary: Rectum normal and penis normal. Guaiac negative stool. No penile tenderness.  Prostate 1-2+ w/o nodules  Musculoskeletal: Normal range of motion. He exhibits no edema and no tenderness.  cane  Lymphadenopathy:    He has no cervical adenopathy.  Neurological: He is alert and oriented to person, place, and time. He has normal reflexes. A cranial nerve deficit (aphasic) is present. No sensory deficit. He exhibits normal muscle tone. Coordination and gait abnormal.  Skin: Skin is warm and dry. No rash noted. He is not diaphoretic. No erythema. No pallor.  Psychiatric: He has a normal mood and affect. His behavior is normal. Judgment and thought content normal.  He is in a w/c today  Lab Results  Component Value Date   WBC 9.1 10/27/2011   HGB 13.3 10/27/2011   HCT 38.9* 10/27/2011   PLT 270.0 10/27/2011   GLUCOSE 87 10/29/2012   CHOL 114 04/29/2012   TRIG 107.0 04/29/2012   HDL 53.20 04/29/2012   LDLCALC 39 04/29/2012   ALT 11 04/29/2012   AST 18 04/29/2012   NA 139   10/29/2012   K 3.6 10/29/2012   CL 101 10/29/2012   CREATININE 1.8* 10/29/2012   BUN 31* 10/29/2012   CO2 29 10/29/2012   TSH 2.81 04/29/2012   PSA 2.37 10/07/2010   HGBA1C 5.6 06/11/2009           Assessment & Plan:   

## 2013-10-13 NOTE — Assessment & Plan Note (Signed)
Chronic  11/14 worse PT at home/Home care

## 2013-10-13 NOTE — Assessment & Plan Note (Signed)
Worse PT at home

## 2013-10-17 ENCOUNTER — Telehealth: Payer: Self-pay | Admitting: *Deleted

## 2013-10-17 DIAGNOSIS — G20A1 Parkinson's disease without dyskinesia, without mention of fluctuations: Secondary | ICD-10-CM

## 2013-10-17 DIAGNOSIS — G20 Parkinson's disease: Secondary | ICD-10-CM

## 2013-10-17 LAB — URINALYSIS
Hgb urine dipstick: NEGATIVE
Leukocytes, UA: NEGATIVE
Specific Gravity, Urine: 1.02 (ref 1.000–1.030)
Urine Glucose: NEGATIVE
Urobilinogen, UA: 0.2 (ref 0.0–1.0)
pH: 6 (ref 5.0–8.0)

## 2013-10-17 NOTE — Telephone Encounter (Signed)
Number busy   Will try again later.

## 2013-10-17 NOTE — Telephone Encounter (Signed)
I informed pt's wife of normal CXR. She wants to know what is causing his cough and occasional SOB and wheezing. What can be done for this?

## 2013-10-17 NOTE — Telephone Encounter (Signed)
[name] [name] called requesting an order for a Hospital Bed be faxed to [facility].  She further requests OT and HH Aide to assist with bath.  Please advise

## 2013-10-17 NOTE — Telephone Encounter (Signed)
As we discussed: chronic mild aspiration is causing the cough. Try OTC Nexium one a day for stomach acid suppression and use Tramadol 1/4 -1/2 tab bid prn for cough suppression. Thx

## 2013-10-18 LAB — CULTURE, URINE COMPREHENSIVE: Organism ID, Bacteria: NO GROWTH

## 2013-10-18 NOTE — Telephone Encounter (Signed)
[facility]ed to [facility] as requested

## 2013-10-18 NOTE — Telephone Encounter (Signed)
OK all Thx

## 2013-10-18 NOTE — Telephone Encounter (Signed)
Pt's wife informed of below.  

## 2013-10-21 ENCOUNTER — Encounter: Payer: Self-pay | Admitting: *Deleted

## 2013-11-10 ENCOUNTER — Telehealth: Payer: Self-pay | Admitting: *Deleted

## 2013-11-10 NOTE — Telephone Encounter (Signed)
[facility]z Fax: [phone] From: [facility] Date/ Time: 11/10/2013 12:22 PM Taken By: Jinny Sanders, CSR Caller: Miranda Facility: not collected Patient: [name] [name], [name] [name] DOB: [birth year] Phone: [phone] Reason for Call: [facility]-trying to get orders for hospital bed and air loft mattress, was faxed on 11/02/2013 Regarding Appointment: Appt Date: Appt Time: Unknown Provider: Reason: Details:

## 2013-11-10 NOTE — Telephone Encounter (Signed)
Ok Thx 

## 2013-11-11 NOTE — Telephone Encounter (Signed)
Form refaxed as requested.  Miranda notified

## 2013-11-14 ENCOUNTER — Telehealth: Payer: Self-pay | Admitting: *Deleted

## 2013-11-14 NOTE — Telephone Encounter (Signed)
Caller left vm requesting status of hospital bed order. The order was faxed previously by Salley Scarlet, triage nurse.  I called [facility] to see what else was needed from [place]. The lady I spoke to at [facility] stated they needed most recent OV note faxed to them. I faxed 10/13/13 OV note to [phone]. I tried to inform pt's wife Mrs. Alvillar of this. I called number given above and received no answer. I called pt's home phone and was hung up on twice. Closing phone note.

## 2013-11-25 ENCOUNTER — Ambulatory Visit (INDEPENDENT_AMBULATORY_CARE_PROVIDER_SITE_OTHER): Payer: Medicare Other | Admitting: Internal Medicine

## 2013-11-25 ENCOUNTER — Other Ambulatory Visit (INDEPENDENT_AMBULATORY_CARE_PROVIDER_SITE_OTHER): Payer: Medicare Other

## 2013-11-25 ENCOUNTER — Encounter: Payer: Self-pay | Admitting: Internal Medicine

## 2013-11-25 ENCOUNTER — Ambulatory Visit (INDEPENDENT_AMBULATORY_CARE_PROVIDER_SITE_OTHER)
Admission: RE | Admit: 2013-11-25 | Discharge: 2013-11-25 | Disposition: A | Discharge status: Home / Self Care | Payer: Medicare Other | Source: Ambulatory Visit | Attending: Internal Medicine | Admitting: Internal Medicine

## 2013-11-25 VITALS — BP 100/72 | HR 67 | Temp 98.3°F | Wt 163.0 lb

## 2013-11-25 DIAGNOSIS — R059 Cough, unspecified: Secondary | ICD-10-CM

## 2013-11-25 DIAGNOSIS — R05 Cough: Secondary | ICD-10-CM

## 2013-11-25 DIAGNOSIS — Z7189 Other specified counseling: Secondary | ICD-10-CM

## 2013-11-25 LAB — CBC WITH DIFFERENTIAL/PLATELET
Basophils Absolute: 0 10*3/uL (ref 0.0–0.1)
Basophils Relative: 0.3 % (ref 0.0–3.0)
EOS ABS: 0.1 10*3/uL (ref 0.0–0.7)
EOS PCT: 1 % (ref 0.0–5.0)
HCT: 39.5 % (ref 39.0–52.0)
Hemoglobin: 13.5 g/dL (ref 13.0–17.0)
Lymphocytes Relative: 20.2 % (ref 12.0–46.0)
Lymphs Abs: 2.4 10*3/uL (ref 0.7–4.0)
MCHC: 34.2 g/dL (ref 30.0–36.0)
MCV: 94.9 fl (ref 78.0–100.0)
Monocytes Absolute: 1.4 10*3/uL — ABNORMAL HIGH (ref 0.1–1.0)
Monocytes Relative: 11.9 % (ref 3.0–12.0)
Neutro Abs: 7.9 10*3/uL — ABNORMAL HIGH (ref 1.4–7.7)
Neutrophils Relative %: 66.6 % (ref 43.0–77.0)
PLATELETS: 329 10*3/uL (ref 150.0–400.0)
RBC: 4.17 Mil/uL — ABNORMAL LOW (ref 4.22–5.81)
RDW: 13.6 % (ref 11.5–14.6)
WBC: 11.8 10*3/uL — ABNORMAL HIGH (ref 4.5–10.5)

## 2013-11-25 MED ORDER — MOXIFLOXACIN HCL 400 MG PO TABS: 400.0000 mg | ORAL_TABLET | Freq: Every day | ORAL | Status: DC

## 2013-11-25 NOTE — Patient Instructions (Signed)
Exam today is difficult - hard to auscultate the lungs since he doesn't take a deep breath. No fever today and blood pressure that is close to his baseline are good signs.  Plan Chest xray to look for pneumonia or fluid in the lung  Complete blood count - to look for signs of infectin.  [facility] - to think about the things that can be done and to think through what should be done in regard to invasive care.   Plan [facility] paket  Sample MOST form  Please visit [email or web address].

## 2013-11-25 NOTE — Progress Notes (Signed)
Pre visit review using our clinic review tool, if applicable. No additional management support is needed unless otherwise documented below in the visit note. 

## 2013-11-25 NOTE — Progress Notes (Signed)
Subjective:    Patient ID: [name] [name], male    DOB: 02/24/1923, 80 y.o.   MRN: [id number]  HPI Mr. Geiman presents for a several day h/o change in energy level, slumped over, had trouble feeding himself, he had low grade fever. [facility] nurse reported to family that he had fluid in the lung. Family reports that he is doing better: eating again now and at his baseline.   He has Parkinson's disease. He has been diagnosed as having chronic aspiration. Family has been doing aspirations  Past Medical History  Diagnosis Date  . Hypertension   . Cancer     colon cancer, hx of 2005  . Osteoarthritis   . Dysarthria   . Hypothyroidism   . History of CVA (cerebrovascular accident)   . Tremor   . History of skin cancer 2009  . UTI (urinary tract infection) 2009, 2011  . Psoriasis    Past Surgical History  Procedure Laterality Date  . Colectomy     Family History  Problem Relation Age of Onset  . Hypertension Other   . Hypertension Father    History   Social History  . Marital Status: Married    Spouse Name: N/A    Number of Children: N/A  . Years of Education: N/A   Occupational History  . Not on file.   Social History Main Topics  . Smoking status: Former Research scientist (life sciences)  . Smokeless tobacco: Not on file  . Alcohol Use: No  . Drug Use: No  . Sexual Activity: No   Other Topics Concern  . Not on file   Social History Narrative   Married   Retired    Current Outpatient Prescriptions on File Prior to Visit  Medication Sig Dispense Refill  . acetaminophen (TYLENOL) 500 MG tablet Take 1,000 mg by mouth every 4 (four) hours as needed.      Marland Kitchen amLODipine (NORVASC) 10 MG tablet Take 1 tablet (10 mg total) by mouth daily.  90 tablet  3  . aspirin 81 MG EC tablet Take 81 mg by mouth daily.        Marland Kitchen atenolol-chlorthalidone (TENORETIC) 50-25 MG per tablet Take 1 tablet by mouth daily.  90 tablet  3  . Cholecalciferol (VITAMIN D3) 1000 UNITS CAPS Take 1 capsule by mouth daily.        .  Diclofenac Sodium (PENNSAID) 1.5 % SOLN Place 0.2-0.3 mLs onto the skin 3 (three) times daily as needed. 3-5 gtt to skin three times daily as needed  150 mL  3  . finasteride (PROSCAR) 5 MG tablet Take 1 tablet (5 mg total) by mouth daily.  100 tablet  3  . levothyroxine (SYNTHROID, LEVOTHROID) 100 MCG tablet Take 1 tablet (100 mcg total) by mouth daily.  90 tablet  3  . Petrolatum-Zinc Oxide 28.5-9.14 % OINT Use on sores  113 g  3  . tamsulosin (FLOMAX) 0.4 MG CAPS capsule Take 1 capsule (0.4 mg total) by mouth daily.  90 capsule  3  . traMADol (ULTRAM) 50 MG tablet Take 1 tablet (50 mg total) by mouth daily as needed.  90 tablet  3  . rosuvastatin (CRESTOR) 10 MG tablet Take 1 tablet (10 mg total) by mouth daily.  90 tablet  3  . sulfamethoxazole-trimethoprim (BACTRIM DS,SEPTRA DS) 800-160 MG per tablet Take 1 tablet by mouth once. For UTI prevention qd  90 tablet  3   No current facility-administered medications on file prior to visit.  Review of Systems System review is negative for any constitutional, cardiac, pulmonary, GI or neuro symptoms or complaints other than as described in the HPI.     Objective:   Physical Exam Filed Vitals:   11/25/13 1027  BP: 100/72  Pulse: 67  Temp: 98.3 F (36.8 C)   BP Readings from Last 3 Encounters:  11/25/13 100/72  10/13/13 110/80  06/08/13 112/68   Wt Readings from Last 3 Encounters:  11/25/13 163 lb (73.936 kg)  10/13/13 162 lb (73.483 kg)  06/08/13 164 lb 8 oz (74.617 kg)   Gen'l - Elderly man who is deaf and has expressive aphasia who is in no distress but has Tremor.        Assessment & Plan:  1. Exam today is difficult - hard to auscultate the lungs since he doesn't take a deep breath. No fever today and blood pressure that is close to his baseline are good signs.  Plan Chest xray to look for pneumonia or fluid in the lung  Complete blood count - to look for signs of infectin.  Addendum: CBC w/ WBC 11.8                     CXR: IMPRESSION:  Low lung volumes without evidence of acute cardiopulmonary disease.  Discoid atelectasis right lung base. Atherosclerotic calcifications.  Plan - avelox 400 mg once a day x 7  Advance Care Planning - to think about the things that can be done and to think through what should be done in regard to invasive care.   Plan [facility]  Sample MOST form  Please visit [email or web address].

## 2013-11-28 ENCOUNTER — Telehealth: Payer: Self-pay

## 2013-11-28 MED ORDER — LEVOFLOXACIN 750 MG PO TABS: 750.0000 mg | ORAL_TABLET | Freq: Every day | ORAL | Status: DC

## 2013-11-28 NOTE — Telephone Encounter (Signed)
for the record the pharmacy should have reached out to the MD on call rather than let this ill, fragile patient for thru the weekend w/o antibiotics.   OK for levqauin 750 mg daily x 7 days.

## 2013-11-28 NOTE — Telephone Encounter (Signed)
Phone call from [facility] [phone] stating patient's insurance does not cover moxifloxacin. Generic Levaquin is covered. This will need to [facility] to [facility] [place].

## 2013-11-28 NOTE — Addendum Note (Signed)
Addended by: Roma Schanz R on: 11/28/2013 03:00 PM   Modules accepted: Orders, Medications

## 2013-11-28 NOTE — Telephone Encounter (Signed)
Prescription has been sent.

## 2013-11-29 DIAGNOSIS — L89309 Pressure ulcer of unspecified buttock, unspecified stage: Secondary | ICD-10-CM

## 2013-11-29 DIAGNOSIS — I10 Essential (primary) hypertension: Secondary | ICD-10-CM

## 2013-11-29 DIAGNOSIS — L8993 Pressure ulcer of unspecified site, stage 3: Secondary | ICD-10-CM

## 2013-11-29 DIAGNOSIS — G20 Parkinson's disease: Secondary | ICD-10-CM

## 2013-12-27 DIAGNOSIS — L89309 Pressure ulcer of unspecified buttock, unspecified stage: Secondary | ICD-10-CM

## 2013-12-27 DIAGNOSIS — G20 Parkinson's disease: Secondary | ICD-10-CM

## 2013-12-27 DIAGNOSIS — L8993 Pressure ulcer of unspecified site, stage 3: Secondary | ICD-10-CM

## 2013-12-27 DIAGNOSIS — I10 Essential (primary) hypertension: Secondary | ICD-10-CM

## 2014-01-05 ENCOUNTER — Telehealth: Payer: Self-pay | Admitting: Internal Medicine

## 2014-01-05 NOTE — Telephone Encounter (Signed)
[name] [name] with [facility] is calling to let Dr. Alain Marion know that the pt's family reports that he has not been feeling well and has been very lethargic. [name] [name] states that she believes he may have another bladder infection and wants to know if an antibiotic can be sent to his pharmacy since this is a chronic issue for him. Please advise.

## 2014-01-06 MED ORDER — LEVOFLOXACIN 750 MG PO TABS: 750.0000 mg | ORAL_TABLET | Freq: Every day | ORAL | Status: DC

## 2014-01-06 NOTE — Telephone Encounter (Signed)
I will send the abx Pls go to ER if not well Thx

## 2014-01-11 ENCOUNTER — Telehealth: Payer: Self-pay | Admitting: *Deleted

## 2014-01-11 NOTE — Telephone Encounter (Signed)
Left detailed mess informing Colletta [place]. Tried to call pt's wife also, number was busy. Will try again later.

## 2014-01-11 NOTE — Telephone Encounter (Signed)
Left detailed mess informing pt's wife of below.

## 2014-01-11 NOTE — Telephone Encounter (Signed)
[facility] phoned to confirm MD receipt of medical necessity form request for patient to have a gel-overlay pad.  Please advise.  CB# [phone] ext [phone]

## 2014-01-11 NOTE — Telephone Encounter (Signed)
Ok Thx 

## 2014-01-12 NOTE — Telephone Encounter (Signed)
They are re faxing a letter of medical necessity for MD signature.

## 2014-01-13 ENCOUNTER — Encounter: Payer: Medicare Other | Admitting: Internal Medicine

## 2014-01-24 ENCOUNTER — Telehealth: Payer: Self-pay | Admitting: *Deleted

## 2014-01-24 NOTE — Telephone Encounter (Signed)
Miranda, with [facility], phoned requesting a medical necessity form for a transfer board.  CB# [phone] ext [phone]

## 2014-01-24 NOTE — Telephone Encounter (Signed)
Form was given to MD for completion on 01/24/14. Will fax back once complete.

## 2014-02-01 ENCOUNTER — Ambulatory Visit (INDEPENDENT_AMBULATORY_CARE_PROVIDER_SITE_OTHER): Payer: Medicare Other | Admitting: Internal Medicine

## 2014-02-01 ENCOUNTER — Encounter: Payer: Self-pay | Admitting: Internal Medicine

## 2014-02-01 ENCOUNTER — Other Ambulatory Visit (INDEPENDENT_AMBULATORY_CARE_PROVIDER_SITE_OTHER): Payer: Medicare Other

## 2014-02-01 VITALS — BP 130/70 | HR 68 | Temp 97.7°F | Wt 151.0 lb

## 2014-02-01 DIAGNOSIS — I10 Essential (primary) hypertension: Secondary | ICD-10-CM

## 2014-02-01 DIAGNOSIS — R739 Hyperglycemia, unspecified: Secondary | ICD-10-CM

## 2014-02-01 DIAGNOSIS — R209 Unspecified disturbances of skin sensation: Secondary | ICD-10-CM

## 2014-02-01 DIAGNOSIS — R7309 Other abnormal glucose: Secondary | ICD-10-CM

## 2014-02-01 DIAGNOSIS — Z0000 Encounter for general adult medical examination without abnormal findings: Secondary | ICD-10-CM

## 2014-02-01 DIAGNOSIS — N390 Urinary tract infection, site not specified: Secondary | ICD-10-CM

## 2014-02-01 DIAGNOSIS — E785 Hyperlipidemia, unspecified: Secondary | ICD-10-CM

## 2014-02-01 DIAGNOSIS — R202 Paresthesia of skin: Secondary | ICD-10-CM

## 2014-02-01 DIAGNOSIS — R269 Unspecified abnormalities of gait and mobility: Secondary | ICD-10-CM

## 2014-02-01 DIAGNOSIS — N320 Bladder-neck obstruction: Secondary | ICD-10-CM

## 2014-02-01 DIAGNOSIS — Z23 Encounter for immunization: Secondary | ICD-10-CM

## 2014-02-01 LAB — HEPATIC FUNCTION PANEL
ALBUMIN: 3.8 g/dL (ref 3.5–5.2)
ALK PHOS: 52 U/L (ref 39–117)
ALT: 9 U/L (ref 0–53)
AST: 14 U/L (ref 0–37)
BILIRUBIN DIRECT: 0.1 mg/dL (ref 0.0–0.3)
TOTAL PROTEIN: 7.1 g/dL (ref 6.0–8.3)
Total Bilirubin: 0.7 mg/dL (ref 0.3–1.2)

## 2014-02-01 LAB — CBC WITH DIFFERENTIAL/PLATELET
BASOS ABS: 0 10*3/uL (ref 0.0–0.1)
Basophils Relative: 0.2 % (ref 0.0–3.0)
EOS ABS: 0.4 10*3/uL (ref 0.0–0.7)
Eosinophils Relative: 3.3 % (ref 0.0–5.0)
HEMATOCRIT: 40.7 % (ref 39.0–52.0)
HEMOGLOBIN: 13.7 g/dL (ref 13.0–17.0)
Lymphocytes Relative: 21 % (ref 12.0–46.0)
Lymphs Abs: 2.5 10*3/uL (ref 0.7–4.0)
MCHC: 33.8 g/dL (ref 30.0–36.0)
MCV: 95.8 fl (ref 78.0–100.0)
MONO ABS: 1.2 10*3/uL — AB (ref 0.1–1.0)
Monocytes Relative: 10 % (ref 3.0–12.0)
NEUTROS ABS: 7.9 10*3/uL — AB (ref 1.4–7.7)
Neutrophils Relative %: 65.5 % (ref 43.0–77.0)
PLATELETS: 348 10*3/uL (ref 150.0–400.0)
RBC: 4.25 Mil/uL (ref 4.22–5.81)
RDW: 13.5 % (ref 11.5–14.6)
WBC: 12 10*3/uL — ABNORMAL HIGH (ref 4.5–10.5)

## 2014-02-01 LAB — LIPID PANEL
CHOLESTEROL: 210 mg/dL — AB (ref 0–200)
HDL: 45.6 mg/dL (ref >39.00)
LDL Cholesterol: 125 mg/dL — ABNORMAL HIGH (ref 0–99)
TRIGLYCERIDES: 198 mg/dL — AB (ref 0.0–149.0)
Total CHOL/HDL Ratio: 5
VLDL: 39.6 mg/dL (ref 0.0–40.0)

## 2014-02-01 LAB — HEMOGLOBIN A1C: Hgb A1c MFr Bld: 5.6 % (ref 4.6–6.5)

## 2014-02-01 LAB — BASIC METABOLIC PANEL
BUN: 32 mg/dL — ABNORMAL HIGH (ref 6–23)
CALCIUM: 9.5 mg/dL (ref 8.4–10.5)
CO2: 28 mEq/L (ref 19–32)
Chloride: 100 mEq/L (ref 96–112)
Creatinine, Ser: 1.2 mg/dL (ref 0.4–1.5)
GFR: 60.05 mL/min (ref >60.00)
Glucose, Bld: 90 mg/dL (ref 70–99)
Potassium: 3.5 mEq/L (ref 3.5–5.1)
SODIUM: 138 meq/L (ref 135–145)

## 2014-02-01 LAB — PSA: PSA: 2.47 ng/mL (ref 0.10–4.00)

## 2014-02-01 LAB — TSH: TSH: 1.51 u[IU]/mL (ref 0.35–5.50)

## 2014-02-01 LAB — VITAMIN B12: Vitamin B-12: 302 pg/mL (ref 211–911)

## 2014-02-01 MED ORDER — ATENOLOL-CHLORTHALIDONE 50-25 MG PO TABS: 0.5000 | ORAL_TABLET | Freq: Every day | ORAL | Status: DC

## 2014-02-01 MED ORDER — AMLODIPINE BESYLATE 10 MG PO TABS: 5.0000 mg | ORAL_TABLET | Freq: Every day | ORAL | Status: DC

## 2014-02-01 MED ORDER — LEVOFLOXACIN 500 MG PO TABS
500.0000 mg | ORAL_TABLET | Freq: Every day | ORAL | Status: DC
Start: 2014-02-01 — End: 2014-02-19

## 2014-02-01 NOTE — Progress Notes (Signed)
Pre visit review using our clinic review tool, if applicable. No additional management support is needed unless otherwise documented below in the visit note. 

## 2014-02-01 NOTE — Progress Notes (Signed)
Subjective:     HPI  F/u more tremors going on lately. Sinemet made him sleepy. Unable to walk x 1.5 months. Using a w/c. The patient needs to address   to address chronic  hyperlipidemia controlled with medicines and to address his chronic UTI/BPH/incontinence. C/o loss of bladder control; urine w/odor  BP Readings from Last 3 Encounters:  02/02/13 110/60  11/03/12 110/62  05/05/12 104/68   BP Readings from Last 3 Encounters:  02/02/13 110/60  11/03/12 110/62  05/05/12 104/68      Review of Systems  Constitutional: Positive for fatigue. Negative for appetite change and unexpected weight change.  HENT: Negative for nosebleeds, congestion, sore throat, sneezing, trouble swallowing and neck pain.   Eyes: Negative for itching and visual disturbance.  Respiratory: Negative for cough.   Cardiovascular: Negative for chest pain, palpitations and leg swelling.  Gastrointestinal: Negative for nausea, diarrhea, blood in stool and abdominal distention.  Genitourinary: Positive for decreased urine volume and difficulty urinating. Negative for frequency and hematuria.       Incontinent sometimes  Musculoskeletal: Negative for back pain, joint swelling and gait problem.  Skin: Negative for rash.  Neurological: Positive for speech difficulty. Negative for dizziness, tremors and weakness.  Psychiatric/Behavioral: Negative for suicidal ideas, sleep disturbance, dysphoric mood and agitation. The patient is not nervous/anxious.        Objective:   Physical Exam  Constitutional: He is oriented to person, place, and time. He appears well-developed and well-nourished. No distress.  HENT:  Head: Normocephalic and atraumatic.  Right Ear: External ear normal.  Left Ear: External ear normal.  Nose: Nose normal.  Mouth/Throat: Oropharynx is clear and moist. No oropharyngeal exudate.  Eyes: Conjunctivae and EOM are normal. Pupils are equal, round, and reactive to light. Right eye exhibits no  discharge. Left eye exhibits no discharge. No scleral icterus.  Neck: Normal range of motion. Neck supple. No JVD present. No tracheal deviation present. No thyromegaly present.  Cardiovascular: Normal rate, regular rhythm, normal heart sounds and intact distal pulses.  Exam reveals no gallop and no friction rub.   No murmur heard. Pulmonary/Chest: Effort normal and breath sounds normal. No stridor. No respiratory distress. He has no wheezes. He has no rales. He exhibits no tenderness.  Abdominal: Soft. Bowel sounds are normal. He exhibits no distension and no mass. There is no tenderness. There is no rebound and no guarding.  Genitourinary: Rectum normal and penis normal. Guaiac negative stool. No penile tenderness.  Prostate 1-2+ w/o nodules  Musculoskeletal: Normal range of motion. He exhibits no edema and no tenderness.  cane  Lymphadenopathy:    He has no cervical adenopathy.  Neurological: He is alert and oriented to person, place, and time. He has normal reflexes. A cranial nerve deficit (aphasic) is present. No sensory deficit. He exhibits normal muscle tone. Coordination and gait abnormal.  Skin: Skin is warm and dry. No rash noted. He is not diaphoretic. No erythema. No pallor.  Psychiatric: He has a normal mood and affect. His behavior is normal. Judgment and thought content normal.  He is in a w/c today  Lab Results  Component Value Date   WBC 9.1 10/27/2011   HGB 13.3 10/27/2011   HCT 38.9* 10/27/2011   PLT 270.0 10/27/2011   GLUCOSE 87 10/29/2012   CHOL 114 04/29/2012   TRIG 107.0 04/29/2012   HDL 53.20 04/29/2012   LDLCALC 39 04/29/2012   ALT 11 04/29/2012   AST 18 04/29/2012   NA 139  10/29/2012   K 3.6 10/29/2012   CL 101 10/29/2012   CREATININE 1.8* 10/29/2012   BUN 31* 10/29/2012   CO2 29 10/29/2012   TSH 2.81 04/29/2012   PSA 2.37 10/07/2010   HGBA1C 5.6 06/11/2009           Assessment & Plan:

## 2014-02-01 NOTE — Assessment & Plan Note (Signed)
The patient is here for annual Medicare wellness examination and management of other chronic and acute problems.   The risk factors are reflected in the social history.  The roster of all physicians providing medical care to patient - is listed in the Snapshot section of the chart.  Activities of daily living:  The patient is 100% dpendent in all ADLs: dressing, toileting, feeding as well as independent mobility - w/c only  Home safety : The patient has smoke detectors in the home. They wear seatbelts.No firearms at home ( firearms are present in the home, kept in a safe fashion). There is no violence in the home.   There is no risks for hepatitis, STDs or HIV. There is no   history of blood transfusion. They have no travel history to infectious disease endemic areas of the world.  The patient has (has not) seen their dentist in the last six month. They have (not) seen their eye doctor in the last year. They deny (admit to) any hearing difficulty and have not had audiologic testing in the last year.  They do not  have excessive sun exposure. Discussed the need for sun protection: hats, long sleeves and use of sunscreen if there is significant sun exposure.   Diet: the importance of a healthy diet is discussed. They do have a healthy (unhealthy-high fat/fast food) diet. Aspirating on water.  The patient has a regular exercise program: no.   Depression screen: there are some signs or vegative symptoms of depression -  change in appetite, anhedonia, sadness/tearfullness.  Cognitive assessment: the patient has limited functioning - aphasic. The following portions of the patient's history were reviewed and updated as appropriate: allergies, current medications, past family history, past medical history,  past surgical history, past social history  and problem list.  Vision, hearing - decreased, body mass index were assessed and reviewed.   During the course of the visit the patient was educated  and counseled about appropriate screening and preventive services including : fall prevention , diabetes screening, nutrition counseling, colorectal cancer screening, and recommended immunizations.

## 2014-02-01 NOTE — Assessment & Plan Note (Signed)
levaquin Rx prn

## 2014-02-01 NOTE — Assessment & Plan Note (Signed)
Continue with current prescription therapy as reflected on the Med list.  

## 2014-02-01 NOTE — Assessment & Plan Note (Signed)
Worse -- w/c

## 2014-02-03 LAB — URINALYSIS
Bilirubin Urine: NEGATIVE
Hgb urine dipstick: NEGATIVE
KETONES UR: NEGATIVE
Leukocytes, UA: NEGATIVE
Nitrite: NEGATIVE
PH: 6 (ref 5.0–8.0)
Specific Gravity, Urine: 1.02 (ref 1.000–1.030)
TOTAL PROTEIN, URINE-UPE24: NEGATIVE
Urine Glucose: NEGATIVE
Urobilinogen, UA: 0.2 (ref 0.0–1.0)

## 2014-02-19 ENCOUNTER — Emergency Department (HOSPITAL_COMMUNITY)
Admission: EM | Admit: 2014-02-19 | Discharge: 2014-02-19 | Disposition: A | Discharge status: Home / Self Care | Payer: Medicare Other | Attending: Emergency Medicine | Admitting: Emergency Medicine

## 2014-02-19 ENCOUNTER — Emergency Department (HOSPITAL_COMMUNITY): Payer: Medicare Other

## 2014-02-19 ENCOUNTER — Encounter (HOSPITAL_COMMUNITY): Payer: Self-pay | Admitting: Emergency Medicine

## 2014-02-19 DIAGNOSIS — Z7982 Long term (current) use of aspirin: Secondary | ICD-10-CM | POA: Insufficient documentation

## 2014-02-19 DIAGNOSIS — Z85038 Personal history of other malignant neoplasm of large intestine: Secondary | ICD-10-CM | POA: Insufficient documentation

## 2014-02-19 DIAGNOSIS — Z8673 Personal history of transient ischemic attack (TIA), and cerebral infarction without residual deficits: Secondary | ICD-10-CM | POA: Insufficient documentation

## 2014-02-19 DIAGNOSIS — Z85828 Personal history of other malignant neoplasm of skin: Secondary | ICD-10-CM | POA: Insufficient documentation

## 2014-02-19 DIAGNOSIS — Z87440 Personal history of urinary (tract) infections: Secondary | ICD-10-CM | POA: Insufficient documentation

## 2014-02-19 DIAGNOSIS — D72829 Elevated white blood cell count, unspecified: Secondary | ICD-10-CM | POA: Insufficient documentation

## 2014-02-19 DIAGNOSIS — Z872 Personal history of diseases of the skin and subcutaneous tissue: Secondary | ICD-10-CM | POA: Insufficient documentation

## 2014-02-19 DIAGNOSIS — I10 Essential (primary) hypertension: Secondary | ICD-10-CM | POA: Insufficient documentation

## 2014-02-19 DIAGNOSIS — R05 Cough: Secondary | ICD-10-CM

## 2014-02-19 DIAGNOSIS — Z79899 Other long term (current) drug therapy: Secondary | ICD-10-CM | POA: Insufficient documentation

## 2014-02-19 DIAGNOSIS — E039 Hypothyroidism, unspecified: Secondary | ICD-10-CM | POA: Insufficient documentation

## 2014-02-19 DIAGNOSIS — M1990 Unspecified osteoarthritis, unspecified site: Secondary | ICD-10-CM | POA: Insufficient documentation

## 2014-02-19 DIAGNOSIS — R259 Unspecified abnormal involuntary movements: Secondary | ICD-10-CM | POA: Insufficient documentation

## 2014-02-19 DIAGNOSIS — Z87891 Personal history of nicotine dependence: Secondary | ICD-10-CM | POA: Insufficient documentation

## 2014-02-19 DIAGNOSIS — R059 Cough, unspecified: Secondary | ICD-10-CM | POA: Insufficient documentation

## 2014-02-19 LAB — CBC WITH DIFFERENTIAL/PLATELET
BASOS PCT: 0 % (ref 0–1)
Basophils Absolute: 0 10*3/uL (ref 0.0–0.1)
EOS ABS: 0.2 10*3/uL (ref 0.0–0.7)
Eosinophils Relative: 1 % (ref 0–5)
HEMATOCRIT: 42.2 % (ref 39.0–52.0)
HEMOGLOBIN: 14.5 g/dL (ref 13.0–17.0)
LYMPHS ABS: 2.4 10*3/uL (ref 0.7–4.0)
Lymphocytes Relative: 14 % (ref 12–46)
MCH: 33.7 pg (ref 26.0–34.0)
MCHC: 34.4 g/dL (ref 30.0–36.0)
MCV: 98.1 fL (ref 78.0–100.0)
MONOS PCT: 11 % (ref 3–12)
Monocytes Absolute: 1.9 10*3/uL — ABNORMAL HIGH (ref 0.1–1.0)
Neutro Abs: 12.4 10*3/uL — ABNORMAL HIGH (ref 1.7–7.7)
Neutrophils Relative %: 73 % (ref 43–77)
Platelets: 348 10*3/uL (ref 150–400)
RBC: 4.3 MIL/uL (ref 4.22–5.81)
RDW: 13.6 % (ref 11.5–15.5)
WBC: 17 10*3/uL — ABNORMAL HIGH (ref 4.0–10.5)

## 2014-02-19 LAB — URINALYSIS, ROUTINE W REFLEX MICROSCOPIC
BILIRUBIN URINE: NEGATIVE
GLUCOSE, UA: NEGATIVE mg/dL
HGB URINE DIPSTICK: NEGATIVE
Ketones, ur: NEGATIVE mg/dL
Leukocytes, UA: NEGATIVE
Nitrite: NEGATIVE
PH: 5.5 (ref 5.0–8.0)
Protein, ur: NEGATIVE mg/dL
Specific Gravity, Urine: 1.019 (ref 1.005–1.030)
Urobilinogen, UA: 0.2 mg/dL (ref 0.0–1.0)

## 2014-02-19 LAB — I-STAT TROPONIN, ED: Troponin i, poc: 0 ng/mL (ref 0.00–0.08)

## 2014-02-19 LAB — BASIC METABOLIC PANEL
BUN: 27 mg/dL — AB (ref 6–23)
CO2: 28 mEq/L (ref 19–32)
CREATININE: 1.11 mg/dL (ref 0.50–1.35)
Calcium: 9.4 mg/dL (ref 8.4–10.5)
Chloride: 99 mEq/L (ref 96–112)
GFR, EST AFRICAN AMERICAN: 66 mL/min — AB (ref >90)
GFR, EST NON AFRICAN AMERICAN: 57 mL/min — AB (ref >90)
GLUCOSE: 92 mg/dL (ref 70–99)
Potassium: 3.4 mEq/L — ABNORMAL LOW (ref 3.7–5.3)
Sodium: 142 mEq/L (ref 137–147)

## 2014-02-19 LAB — I-STAT CG4 LACTIC ACID, ED: Lactic Acid, Venous: 1.88 mmol/L (ref 0.5–2.2)

## 2014-02-19 NOTE — ED Notes (Signed)
According to EMS, the patient's family said the patient was sitting in his recliner and began to have SOB and grabbed his right, lower side.  The patient's family called EMS because they thought his breathing was abnormal.  EMS said he does feel hot.  They put an 18gauge in the left [facility] but did not administer any medication.  He showed to be in Afib on the 12lead EKG.  His lungs sounded clear however he does have a history of aspiration pneumonia.  According to EMS the patient's baseline is he does not talk but he does stand up and can walk with a walker. They transported him here for further evaluation.

## 2014-02-19 NOTE — ED Notes (Signed)
Patient discharged to home with family. NAD.  

## 2014-02-19 NOTE — ED Notes (Signed)
Lactic acid results given to Dr. Roxanne Mins

## 2014-02-19 NOTE — Discharge Instructions (Signed)
Please read and follow all provided instructions.  Your diagnoses today include:  1. Cough   2. Leukocytosis     Tests performed today include:  Blood counts and electrolytes - elevated white blood cells  UA - normal urine test  Chest x-ray - normal, no pneumonia  EKG  Blood test for heart muscle damage - no signs of heart attack  Vital signs. See below for your results today.   Medications prescribed:   None  Take any prescribed medications only as directed.  Home care instructions:  Follow any educational materials contained in this packet.  Follow-up instructions: Please follow-up with your primary care provider in the next 3 days for further evaluation of your symptoms. If you do not have a primary care doctor -- see below for referral information.   Return instructions:   Please return to the Emergency Department if you experience worsening symptoms.   Return with worsening trouble breathing, chest pain  Please return if you have any other emergent concerns.  Additional Information:  Your vital signs today were: BP 116/84   Pulse 94   Temp(Src) 98.5 F (36.9 C) (Rectal)   Resp 21   SpO2 96% If your blood pressure (BP) was elevated above 135/85 this visit, please have this repeated by your doctor within one month. --------------

## 2014-02-19 NOTE — ED Provider Notes (Signed)
  This was a shared visit with a mid-level provided (NP or PA).  Throughout the patient's course I was available for consultation/collaboration.  I saw the ECG (if appropriate), relevant labs and studies - I agree with the interpretation.  On my exam the patient was in no distress.  According to patient's family members the patient was in his usual state of health, did not appear to be uncomfortable. With referring labs, including slight elevation and leukocytosis, patient was discharged.     Carmin Muskrat, MD 02/19/14 1311

## 2014-02-19 NOTE — ED Provider Notes (Signed)
CSN: [id number]     Arrival date & time 02/19/14  0600 History   First MD Initiated Contact with Patient 02/19/14 [phone]     Chief Complaint  Patient presents with  . Shortness of Breath     (Consider location/radiation/quality/duration/timing/severity/associated sxs/prior Treatment) HPI Comments: Patient with history of UTI, aspiration, previous stroke, baseline tremor, wheelchair bound x 2 months, non-verbal -- presents with family. Wife states that in the middle of the night she awoke to the patient reaching for his water bottle. He than began coughing and was assisted by family. He clutched right side which is abnormal for him. Patient now appears back to baseline. The onset of this condition was acute. Aggravating factors: none. Alleviating factors: none.    Patient is a 80 y.o. male presenting with shortness of breath. The history is provided by medical records, a relative and the spouse.  Shortness of Breath Associated symptoms: cough   Associated symptoms: no abdominal pain, no chest pain, no fever, no headaches, no rash, no sore throat and no vomiting     Past Medical History  Diagnosis Date  . Hypertension   . Cancer     colon cancer, hx of 2005  . Osteoarthritis   . Dysarthria   . Hypothyroidism   . History of CVA (cerebrovascular accident)   . Tremor   . History of skin cancer 2009  . UTI (urinary tract infection) 2009, 2011  . Psoriasis    Past Surgical History  Procedure Laterality Date  . Colectomy     Family History  Problem Relation Age of Onset  . Hypertension Other   . Hypertension Father    History  Substance Use Topics  . Smoking status: Former Research scientist (life sciences)  . Smokeless tobacco: Not on file  . Alcohol Use: No    Review of Systems  Constitutional: Negative for fever.  HENT: Negative for rhinorrhea and sore throat.   Eyes: Negative for redness.  Respiratory: Positive for cough and shortness of breath.   Cardiovascular: Negative for chest pain.   Gastrointestinal: Negative for nausea, vomiting, abdominal pain and diarrhea.  Genitourinary: Negative for dysuria.  Musculoskeletal: Negative for myalgias.  Skin: Negative for rash.  Neurological: Positive for tremors (baseline). Negative for headaches.   Allergies  Review of patient's allergies indicates no known allergies.  Home Medications   Current Outpatient Rx  Name  Route  Sig  Dispense  Refill  . acetaminophen (TYLENOL) 500 MG tablet   Oral   Take 1,000 mg by mouth every 4 (four) hours as needed.         Marland Kitchen amLODipine (NORVASC) 10 MG tablet   Oral   Take 0.5 tablets (5 mg total) by mouth daily.   90 tablet   3   . aspirin 81 MG EC tablet   Oral   Take 81 mg by mouth daily.           Marland Kitchen atenolol-chlorthalidone (TENORETIC) 50-25 MG per tablet   Oral   Take 0.5 tablets by mouth daily.   90 tablet   3   . Cholecalciferol (VITAMIN D3) 1000 UNITS CAPS   Oral   Take 1 capsule by mouth daily.           . Diclofenac Sodium (PENNSAID) 1.5 % SOLN   Transdermal   Place 0.2-0.3 mLs onto the skin 3 (three) times daily as needed. 3-5 gtt to skin three times daily as needed   150 mL   3   . finasteride (  PROSCAR) 5 MG tablet   Oral   Take 1 tablet (5 mg total) by mouth daily.   100 tablet   3   . levofloxacin (LEVAQUIN) 500 MG tablet   Oral   Take 1 tablet (500 mg total) by mouth daily.   10 tablet   0   . Petrolatum-Zinc Oxide 28.5-9.14 % OINT      Use on sores   113 g   3   . tamsulosin (FLOMAX) 0.4 MG CAPS capsule   Oral   Take 1 capsule (0.4 mg total) by mouth daily.   90 capsule   3   . traMADol (ULTRAM) 50 MG tablet   Oral   Take 1 tablet (50 mg total) by mouth daily as needed.   90 tablet   3    BP 132/88  Pulse 84  Temp(Src) 98.8 F (37.1 C) (Oral)  Resp 20  SpO2 97%  Physical Exam  Nursing note and vitals reviewed. Constitutional: He appears well-developed and well-nourished.  HENT:  Head: Normocephalic and atraumatic.   Eyes: Conjunctivae are normal. Right eye exhibits no discharge. Left eye exhibits no discharge.  Neck: Normal range of motion. Neck supple.  Cardiovascular: Normal heart sounds.   Distant heart sounds.  Pulmonary/Chest: Effort normal. No respiratory distress. He has no wheezes. He has rhonchi. He has no rales.  Abdominal: Soft. There is no tenderness. There is no rebound and no guarding.  Musculoskeletal: He exhibits no edema and no tenderness.  Neurological: He is alert.  Baseline tremor. Patient follows simple commands, responds to voice, makes eye contact.   Skin: Skin is warm and dry. No rash noted.  Psychiatric: He has a normal mood and affect.    ED Course  Procedures (including critical care time) Labs Review Labs Reviewed  CBC WITH DIFFERENTIAL - Abnormal; Notable for the following:    WBC 17.0 (*)    Neutro Abs 12.4 (*)    Monocytes Absolute 1.9 (*)    All other components within normal limits  BASIC METABOLIC PANEL - Abnormal; Notable for the following:    Potassium 3.4 (*)    BUN 27 (*)    GFR calc non Af Amer 57 (*)    GFR calc Af Amer 66 (*)    All other components within normal limits  URINALYSIS, ROUTINE W REFLEX MICROSCOPIC  I-STAT TROPOININ, ED  I-STAT CG4 LACTIC ACID, ED   Imaging Review Dg Chest 2 View  02/19/2014   CLINICAL DATA:  Shortness of breath.  EXAM: CHEST  2 VIEW  COMPARISON:  11/25/2013  FINDINGS: Cardiomegaly. Low lung volumes. Linear bibasilar densities, likely atelectasis. No overt edema. No effusions. No acute bony abnormality.  IMPRESSION: Cardiomegaly.  Low lung volumes with bibasilar atelectasis   Electronically Signed   By: Rolm Baptise M.D.   On: 02/19/2014 07:28     EKG Interpretation None      6:25 AM Patient seen and examined. Work-up initiated. Tremor is making it impossible to obtain accurate EKG.   Vital signs reviewed and are as follows: Filed Vitals:   02/19/14 0607  BP: 132/88  Pulse: 84  Temp: 98.8 F (37.1 C)   Resp: 20   9:30 AM Patient previously discussed and seen by Dr. Vanita Panda.   Labs reassuring. CXR clear. Patient is at baseline.   Family informed of isolated leukocytosis. Encouraged PCP f/u.   Patient urged to return with worsening symptoms or other concerns. Patient verbalized understanding and agrees with plan.  MDM   Final diagnoses:  Cough  Leukocytosis   Patient with cough/choking this AM. He is at his baseline currently. Labs and imaging performed. Isolated elevated WBC count without obvious source of infection. Patient is not febrile or tachycardic. CXR does not demonstrate PNA. HR nml. Trop neg. Patient appears well. Do not feel there is any indication for admission at this point given reassuring labs, imaging, vitals.      Carlisle Cater, PA-C 02/19/14 [phone]

## 2014-02-19 NOTE — ED Notes (Signed)
Waiting for PTAR 

## 2014-02-19 NOTE — ED Notes (Signed)
Patient transported to X-ray 

## 2014-03-02 ENCOUNTER — Ambulatory Visit (INDEPENDENT_AMBULATORY_CARE_PROVIDER_SITE_OTHER): Payer: Medicare Other | Admitting: Internal Medicine

## 2014-03-02 ENCOUNTER — Encounter: Payer: Self-pay | Admitting: Internal Medicine

## 2014-03-02 VITALS — BP 100/50 | HR 76 | Temp 97.9°F | Resp 16 | Wt 158.0 lb

## 2014-03-02 DIAGNOSIS — G20 Parkinson's disease: Secondary | ICD-10-CM

## 2014-03-02 DIAGNOSIS — G20A1 Parkinson's disease without dyskinesia, without mention of fluctuations: Secondary | ICD-10-CM

## 2014-03-02 DIAGNOSIS — E039 Hypothyroidism, unspecified: Secondary | ICD-10-CM

## 2014-03-02 DIAGNOSIS — R269 Unspecified abnormalities of gait and mobility: Secondary | ICD-10-CM

## 2014-03-02 DIAGNOSIS — I10 Essential (primary) hypertension: Secondary | ICD-10-CM

## 2014-03-02 NOTE — Progress Notes (Signed)
Pre visit review using our clinic review tool, if applicable. No additional management support is needed unless otherwise documented below in the visit note. 

## 2014-03-02 NOTE — Progress Notes (Signed)
Subjective:     HPI  F/u ER visit on 02/19/14 for a bad coughing spell at 5 am that resolved in 30 min - called 911.  F/u more tremors going on lately. Sinemet made him sleepy. Unable to walk x 1.5 months. Using a w/c. The patient needs to address   to address chronic  hyperlipidemia controlled with medicines and to address his chronic UTI/BPH/incontinence. C/o loss of bladder control; urine w/odor  BP Readings from Last 3 Encounters:  02/02/13 110/60  11/03/12 110/62  05/05/12 104/68   BP Readings from Last 3 Encounters:  02/02/13 110/60  11/03/12 110/62  05/05/12 104/68      Review of Systems  Constitutional: Positive for fatigue. Negative for appetite change and unexpected weight change.  HENT: Negative for nosebleeds, congestion, sore throat, sneezing, trouble swallowing and neck pain.   Eyes: Negative for itching and visual disturbance.  Respiratory: Negative for cough.   Cardiovascular: Negative for chest pain, palpitations and leg swelling.  Gastrointestinal: Negative for nausea, diarrhea, blood in stool and abdominal distention.  Genitourinary: Positive for decreased urine volume and difficulty urinating. Negative for frequency and hematuria.       Incontinent sometimes  Musculoskeletal: Negative for back pain, joint swelling and gait problem.  Skin: Negative for rash.  Neurological: Positive for speech difficulty. Negative for dizziness, tremors and weakness.  Psychiatric/Behavioral: Negative for suicidal ideas, sleep disturbance, dysphoric mood and agitation. The patient is not nervous/anxious.        Objective:   Physical Exam  Constitutional:. No distress.  HENT: smiling Head: Normocephalic and atraumatic.  Right Ear: External ear normal.  Left Ear: External ear normal.  Nose: Nose normal.  Mouth/Throat: Oropharynx is clear and moist. No oropharyngeal exudate.  Eyes: Conjunctivae and EOM are normal. Pupils are equal, round, and reactive to light. Right  eye exhibits no discharge. Left eye exhibits no discharge. No scleral icterus.  Neck: Normal range of motion. Neck supple. No JVD present. No tracheal deviation present. No thyromegaly present.  Cardiovascular: Normal rate, regular rhythm, normal heart sounds and intact distal pulses.  Exam reveals no gallop and no friction rub.   No murmur heard. Pulmonary/Chest: Effort normal and breath sounds normal. No stridor. No respiratory distress. He has no wheezes. He has no rales. He exhibits no tenderness.  Abdominal: Soft. Bowel sounds are normal. He exhibits no distension and no mass. There is no tenderness. There is no rebound and no guarding.  Genitourinary: Rectum normal and penis normal. Guaiac negative stool. No penile tenderness.   Musculoskeletal: Normal range of motion. He exhibits no edema and no tenderness.  w/c  Lymphadenopathy:    He has no cervical adenopathy.  Neurological: He is alert and oriented to person, place, and time. He has normal reflexes. A cranial nerve deficit (aphasic) is present. No sensory deficit. He exhibits normal muscle tone. Coordination and gait abnormal.  Skin: Skin is warm and dry. No rash noted. He is not diaphoretic. No erythema. No pallor.  Psychiatric: He has a normal mood and affect. His behavior is normal. Judgment and thought content normal.  He is in a w/c today  Lab Results  Component Value Date   WBC 9.1 10/27/2011   HGB 13.3 10/27/2011   HCT 38.9* 10/27/2011   PLT 270.0 10/27/2011   GLUCOSE 87 10/29/2012   CHOL 114 04/29/2012   TRIG 107.0 04/29/2012   HDL 53.20 04/29/2012   LDLCALC 39 04/29/2012   ALT 11 04/29/2012   AST 18  04/29/2012   NA 139 10/29/2012   K 3.6 10/29/2012   CL 101 10/29/2012   CREATININE 1.8* 10/29/2012   BUN 31* 10/29/2012   CO2 29 10/29/2012   TSH 2.81 04/29/2012   PSA 2.37 10/07/2010   HGBA1C 5.6 06/11/2009           Assessment & Plan:

## 2014-03-12 ENCOUNTER — Encounter: Payer: Self-pay | Admitting: Internal Medicine

## 2014-03-12 NOTE — Assessment & Plan Note (Signed)
Continue with current prescription therapy as reflected on the Med list.  

## 2014-03-12 NOTE — Assessment & Plan Note (Signed)
W/c bound  

## 2014-03-12 NOTE — Assessment & Plan Note (Signed)
Chronic sx's 

## 2014-03-15 ENCOUNTER — Telehealth: Payer: Self-pay | Admitting: *Deleted

## 2014-03-15 NOTE — Telephone Encounter (Signed)
A user error has taken place.

## 2014-03-31 ENCOUNTER — Telehealth: Payer: Self-pay | Admitting: Internal Medicine

## 2014-03-31 NOTE — Telephone Encounter (Signed)
Patient Information:  Caller Name: Izora Gala  Phone: [phone]  Patient: [name], [name]  Gender: Male  DOB: [birth year]  Age: 80 Years  PCP: Plotnikov, Alex (Adults only)  Office Follow Up:  Does the office need to follow up with this patient?: No  Instructions For The Office: N/A  RN Note:  Contacted Dr Romualdo Bolk advised to hold Amlodipine and Atenolol and only restart meds if SBP is 140 or higher; also advised to give antibiotic that wife has a refill for if she feels that he may have a UTI.  Symptoms  Reason For Call & Symptoms: Calling because BP keeps running low--BP 94/64 today. Hasn't given any BP med(Amlodipine or Atenolol) for 2 days. He was more alert yesterday and this morning but not as alert right now. Wife wants to know if it could possibly be from not giving him his BP med and if she should give it to him now. Also has had TIA's and UTI's and wife isn't sure if that might be happening as well because he isn't able to communicate. Wife stated that urine is cloudy.  Reviewed Health History In EMR: Yes  Reviewed Medications In EMR: Yes  Reviewed Allergies In EMR: Yes  Reviewed Surgeries / Procedures: Yes  Date of Onset of Symptoms: 03/31/2014  Guideline(s) Used:  No Protocol Available - Sick Adult  Disposition Per Guideline:   Callback by PCP or Subspecialist within 1 Hour  Reason For Disposition Reached:   Nursing judgment  Advice Given:  N/A  Patient Will Follow Care Advice:  YES

## 2014-04-11 ENCOUNTER — Emergency Department (HOSPITAL_COMMUNITY): Payer: Medicare Other

## 2014-04-11 ENCOUNTER — Inpatient Hospital Stay (HOSPITAL_COMMUNITY)
Admission: EM | Admit: 2014-04-11 | Discharge: 2014-04-18 | DRG: 871 | Disposition: A | Discharge status: Skilled Nursing Facility | Payer: Medicare Other | Attending: Internal Medicine | Admitting: Internal Medicine

## 2014-04-11 ENCOUNTER — Encounter (HOSPITAL_COMMUNITY): Payer: Self-pay | Admitting: Emergency Medicine

## 2014-04-11 DIAGNOSIS — R6510 Systemic inflammatory response syndrome (SIRS) of non-infectious origin without acute organ dysfunction: Secondary | ICD-10-CM

## 2014-04-11 DIAGNOSIS — E876 Hypokalemia: Secondary | ICD-10-CM | POA: Diagnosis present

## 2014-04-11 DIAGNOSIS — D485 Neoplasm of uncertain behavior of skin: Secondary | ICD-10-CM

## 2014-04-11 DIAGNOSIS — Z85828 Personal history of other malignant neoplasm of skin: Secondary | ICD-10-CM

## 2014-04-11 DIAGNOSIS — R1310 Dysphagia, unspecified: Secondary | ICD-10-CM

## 2014-04-11 DIAGNOSIS — Z87440 Personal history of urinary (tract) infections: Secondary | ICD-10-CM

## 2014-04-11 DIAGNOSIS — G20 Parkinson's disease: Secondary | ICD-10-CM | POA: Diagnosis present

## 2014-04-11 DIAGNOSIS — R0600 Dyspnea, unspecified: Secondary | ICD-10-CM

## 2014-04-11 DIAGNOSIS — E860 Dehydration: Secondary | ICD-10-CM

## 2014-04-11 DIAGNOSIS — E7800 Pure hypercholesterolemia, unspecified: Secondary | ICD-10-CM

## 2014-04-11 DIAGNOSIS — R6520 Severe sepsis without septic shock: Secondary | ICD-10-CM

## 2014-04-11 DIAGNOSIS — E861 Hypovolemia: Secondary | ICD-10-CM | POA: Diagnosis present

## 2014-04-11 DIAGNOSIS — D649 Anemia, unspecified: Secondary | ICD-10-CM | POA: Diagnosis present

## 2014-04-11 DIAGNOSIS — I4891 Unspecified atrial fibrillation: Secondary | ICD-10-CM

## 2014-04-11 DIAGNOSIS — Z66 Do not resuscitate: Secondary | ICD-10-CM

## 2014-04-11 DIAGNOSIS — N419 Inflammatory disease of prostate, unspecified: Secondary | ICD-10-CM

## 2014-04-11 DIAGNOSIS — R21 Rash and other nonspecific skin eruption: Secondary | ICD-10-CM

## 2014-04-11 DIAGNOSIS — L89109 Pressure ulcer of unspecified part of back, unspecified stage: Secondary | ICD-10-CM | POA: Diagnosis present

## 2014-04-11 DIAGNOSIS — N281 Cyst of kidney, acquired: Secondary | ICD-10-CM

## 2014-04-11 DIAGNOSIS — I959 Hypotension, unspecified: Secondary | ICD-10-CM

## 2014-04-11 DIAGNOSIS — G9340 Encephalopathy, unspecified: Secondary | ICD-10-CM

## 2014-04-11 DIAGNOSIS — E870 Hyperosmolality and hypernatremia: Secondary | ICD-10-CM | POA: Diagnosis present

## 2014-04-11 DIAGNOSIS — Z8679 Personal history of other diseases of the circulatory system: Secondary | ICD-10-CM

## 2014-04-11 DIAGNOSIS — Z79899 Other long term (current) drug therapy: Secondary | ICD-10-CM

## 2014-04-11 DIAGNOSIS — R7309 Other abnormal glucose: Secondary | ICD-10-CM | POA: Diagnosis present

## 2014-04-11 DIAGNOSIS — A419 Sepsis, unspecified organism: Principal | ICD-10-CM

## 2014-04-11 DIAGNOSIS — R627 Adult failure to thrive: Secondary | ICD-10-CM

## 2014-04-11 DIAGNOSIS — R269 Unspecified abnormalities of gait and mobility: Secondary | ICD-10-CM

## 2014-04-11 DIAGNOSIS — R059 Cough, unspecified: Secondary | ICD-10-CM

## 2014-04-11 DIAGNOSIS — L8992 Pressure ulcer of unspecified site, stage 2: Secondary | ICD-10-CM | POA: Diagnosis present

## 2014-04-11 DIAGNOSIS — Z515 Encounter for palliative care: Secondary | ICD-10-CM

## 2014-04-11 DIAGNOSIS — R6521 Severe sepsis with septic shock: Secondary | ICD-10-CM

## 2014-04-11 DIAGNOSIS — IMO0002 Reserved for concepts with insufficient information to code with codable children: Secondary | ICD-10-CM | POA: Diagnosis present

## 2014-04-11 DIAGNOSIS — Z8249 Family history of ischemic heart disease and other diseases of the circulatory system: Secondary | ICD-10-CM

## 2014-04-11 DIAGNOSIS — Z87891 Personal history of nicotine dependence: Secondary | ICD-10-CM

## 2014-04-11 DIAGNOSIS — J189 Pneumonia, unspecified organism: Secondary | ICD-10-CM | POA: Diagnosis present

## 2014-04-11 DIAGNOSIS — R531 Weakness: Secondary | ICD-10-CM

## 2014-04-11 DIAGNOSIS — G20A1 Parkinson's disease without dyskinesia, without mention of fluctuations: Secondary | ICD-10-CM | POA: Diagnosis present

## 2014-04-11 DIAGNOSIS — G20C Parkinsonism, unspecified: Secondary | ICD-10-CM

## 2014-04-11 DIAGNOSIS — N289 Disorder of kidney and ureter, unspecified: Secondary | ICD-10-CM

## 2014-04-11 DIAGNOSIS — R05 Cough: Secondary | ICD-10-CM

## 2014-04-11 DIAGNOSIS — E039 Hypothyroidism, unspecified: Secondary | ICD-10-CM

## 2014-04-11 DIAGNOSIS — R4701 Aphasia: Secondary | ICD-10-CM | POA: Diagnosis present

## 2014-04-11 DIAGNOSIS — M1990 Unspecified osteoarthritis, unspecified site: Secondary | ICD-10-CM

## 2014-04-11 DIAGNOSIS — J984 Other disorders of lung: Secondary | ICD-10-CM | POA: Diagnosis present

## 2014-04-11 DIAGNOSIS — Z7982 Long term (current) use of aspirin: Secondary | ICD-10-CM

## 2014-04-11 DIAGNOSIS — Z0000 Encounter for general adult medical examination without abnormal findings: Secondary | ICD-10-CM

## 2014-04-11 DIAGNOSIS — I10 Essential (primary) hypertension: Secondary | ICD-10-CM

## 2014-04-11 DIAGNOSIS — Z85038 Personal history of other malignant neoplasm of large intestine: Secondary | ICD-10-CM

## 2014-04-11 DIAGNOSIS — N390 Urinary tract infection, site not specified: Secondary | ICD-10-CM

## 2014-04-11 DIAGNOSIS — R918 Other nonspecific abnormal finding of lung field: Secondary | ICD-10-CM

## 2014-04-11 DIAGNOSIS — Z8673 Personal history of transient ischemic attack (TIA), and cerebral infarction without residual deficits: Secondary | ICD-10-CM

## 2014-04-11 DIAGNOSIS — N179 Acute kidney failure, unspecified: Secondary | ICD-10-CM

## 2014-04-11 LAB — CBC WITH DIFFERENTIAL/PLATELET
Basophils Absolute: 0 10*3/uL (ref 0.0–0.1)
Basophils Relative: 0 % (ref 0–1)
Eosinophils Absolute: 0 10*3/uL (ref 0.0–0.7)
Eosinophils Relative: 0 % (ref 0–5)
HCT: 43.4 % (ref 39.0–52.0)
Hemoglobin: 14.1 g/dL (ref 13.0–17.0)
Lymphocytes Relative: 5 % — ABNORMAL LOW (ref 12–46)
Lymphs Abs: 0.9 10*3/uL (ref 0.7–4.0)
MCH: 32.7 pg (ref 26.0–34.0)
MCHC: 32.5 g/dL (ref 30.0–36.0)
MCV: 100.7 fL — ABNORMAL HIGH (ref 78.0–100.0)
Monocytes Absolute: 2.1 10*3/uL — ABNORMAL HIGH (ref 0.1–1.0)
Monocytes Relative: 12 % (ref 3–12)
Neutro Abs: 14.6 10*3/uL — ABNORMAL HIGH (ref 1.7–7.7)
Neutrophils Relative %: 83 % — ABNORMAL HIGH (ref 43–77)
Platelets: 299 10*3/uL (ref 150–400)
RBC: 4.31 MIL/uL (ref 4.22–5.81)
RDW: 15 % (ref 11.5–15.5)
WBC: 17.6 10*3/uL — ABNORMAL HIGH (ref 4.0–10.5)

## 2014-04-11 LAB — URINALYSIS, ROUTINE W REFLEX MICROSCOPIC
Bilirubin Urine: NEGATIVE
Glucose, UA: NEGATIVE mg/dL
Hgb urine dipstick: NEGATIVE
Ketones, ur: NEGATIVE mg/dL
Nitrite: NEGATIVE
Protein, ur: NEGATIVE mg/dL
Specific Gravity, Urine: 1.021 (ref 1.005–1.030)
Urobilinogen, UA: 0.2 mg/dL (ref 0.0–1.0)
pH: 5 (ref 5.0–8.0)

## 2014-04-11 LAB — I-STAT ARTERIAL BLOOD GAS, ED
Acid-base deficit: 1 mmol/L (ref 0.0–2.0)
Bicarbonate: 21 mEq/L (ref 20.0–24.0)
O2 Saturation: 97 %
Patient temperature: 101.4
TCO2: 22 mmol/L (ref 0–100)
pCO2 arterial: 27.6 mmHg — ABNORMAL LOW (ref 35.0–45.0)
pH, Arterial: 7.495 — ABNORMAL HIGH (ref 7.350–7.450)
pO2, Arterial: 83 mmHg (ref 80.0–100.0)

## 2014-04-11 LAB — URINE MICROSCOPIC-ADD ON

## 2014-04-11 LAB — BASIC METABOLIC PANEL
BUN: 71 mg/dL — ABNORMAL HIGH (ref 6–23)
CO2: 23 mEq/L (ref 19–32)
Calcium: 9.6 mg/dL (ref 8.4–10.5)
Chloride: 109 mEq/L (ref 96–112)
Creatinine, Ser: 2.77 mg/dL — ABNORMAL HIGH (ref 0.50–1.35)
GFR calc Af Amer: 22 mL/min — ABNORMAL LOW (ref >90)
GFR calc non Af Amer: 19 mL/min — ABNORMAL LOW (ref >90)
Glucose, Bld: 135 mg/dL — ABNORMAL HIGH (ref 70–99)
Potassium: 4 mEq/L (ref 3.7–5.3)
Sodium: 155 mEq/L — ABNORMAL HIGH (ref 137–147)

## 2014-04-11 LAB — LACTIC ACID, PLASMA: LACTIC ACID, VENOUS: 2 mmol/L (ref 0.5–2.2)

## 2014-04-11 LAB — I-STAT CG4 LACTIC ACID, ED: Lactic Acid, Venous: 4.2 mmol/L — ABNORMAL HIGH (ref 0.5–2.2)

## 2014-04-11 LAB — TROPONIN I: Troponin I: <0.3 ng/mL (ref <0.30)

## 2014-04-11 LAB — GLUCOSE, CAPILLARY: GLUCOSE-CAPILLARY: 99 mg/dL (ref 70–99)

## 2014-04-11 LAB — PRO B NATRIURETIC PEPTIDE: Pro B Natriuretic peptide (BNP): 2283 pg/mL — ABNORMAL HIGH (ref 0–450)

## 2014-04-11 MED ORDER — PIPERACILLIN-TAZOBACTAM IN DEX 2-0.25 GM/50ML IV SOLN
2.2500 g | Freq: Three times a day (TID) | INTRAVENOUS | Status: DC
  Administered 2014-04-11 – 2014-04-12 (×2): 2.25 g via INTRAVENOUS
  Filled 2014-04-11 (×4): qty 50

## 2014-04-11 MED ORDER — SODIUM CHLORIDE 0.9 % IV BOLUS (SEPSIS)
1000.0000 mL | Freq: Once | INTRAVENOUS | Status: AC
  Administered 2014-04-11: 1000 mL via INTRAVENOUS

## 2014-04-11 MED ORDER — SODIUM CHLORIDE 0.9 % IV SOLN: INTRAVENOUS | Status: DC

## 2014-04-11 MED ORDER — HEPARIN SODIUM (PORCINE) 5000 UNIT/ML IJ SOLN
5000.0000 [IU] | Freq: Three times a day (TID) | INTRAMUSCULAR | Status: DC
  Administered 2014-04-12 – 2014-04-13 (×6): 5000 [IU] via SUBCUTANEOUS
  Filled 2014-04-11 (×8): qty 1

## 2014-04-11 MED ORDER — SODIUM CHLORIDE 0.9 % IV SOLN: 250.0000 mL | INTRAVENOUS | Status: DC | PRN

## 2014-04-11 MED ORDER — ACETAMINOPHEN 650 MG RE SUPP
650.0000 mg | Freq: Once | RECTAL | Status: AC
  Administered 2014-04-11: 650 mg via RECTAL
  Filled 2014-04-11: qty 1

## 2014-04-11 MED ORDER — SODIUM CHLORIDE 0.9 % IV SOLN
INTRAVENOUS | Status: DC
  Administered 2014-04-11 – 2014-04-12 (×3): via INTRAVENOUS

## 2014-04-11 MED ORDER — PIPERACILLIN-TAZOBACTAM 3.375 G IVPB 30 MIN
3.3750 g | Freq: Once | INTRAVENOUS | Status: AC
  Administered 2014-04-11: 3.375 g via INTRAVENOUS
  Filled 2014-04-11: qty 50

## 2014-04-11 MED ORDER — VANCOMYCIN HCL IN DEXTROSE 1-5 GM/200ML-% IV SOLN
1000.0000 mg | Freq: Once | INTRAVENOUS | Status: AC
  Administered 2014-04-11: 1000 mg via INTRAVENOUS
  Filled 2014-04-11: qty 200

## 2014-04-11 MED ORDER — SODIUM CHLORIDE 0.9 % IV BOLUS (SEPSIS): 1000.0000 mL | Freq: Once | INTRAVENOUS | Status: DC

## 2014-04-11 NOTE — ED Notes (Signed)
Respiratory stated bi-pap was removed and placed on 4L O2 nasal cannula.

## 2014-04-11 NOTE — ED Notes (Signed)
Lactic acid results given to Dr. Kohut 

## 2014-04-11 NOTE — ED Notes (Signed)
Attempted report 

## 2014-04-11 NOTE — ED Notes (Signed)
Asked Beverly NS to call Level 2 Code sepsis on patient.

## 2014-04-11 NOTE — H&P (Signed)
PULMONARY / CRITICAL CARE MEDICINE   Name: [name] [name] MRN: [id number] DOB: Apr 09, 1923    ADMISSION DATE:  04/11/2014 CONSULTATION DATE:    REFERRING MD :  Dr. Eakes Singer PRIMARY SERVICE: PCCM   CHIEF COMPLAINT:  Sepsis   BRIEF PATIENT DESCRIPTION: 80 y/o M with PMH of advanced Parkinson's disease, recurrent UTI's & aspiration admitted 5/19 with sepsis thought related to UTI.    SIGNIFICANT EVENTS / STUDIES:  5/19 - admit with sepsis / urinary source   LINES / TUBES:   CULTURES: UC 5/19 >> BCx2 5/19 >>  ANTIBIOTICS: Vanco 5/19 >> Zosyn 5/19 >>   HISTORY OF PRESENT ILLNESS:  80 y/o M with PMH of HTN, Colon Cancer (2005), Osteoarthritis, Dysarthria (unable to speak for last 5 years), Hypothyroidism, CVA, Frequent UTI's and aspiration who presented to [facility] ER on 5/19 with family reporting "fast breathing & less alert".  Patient at baseline is non-verbal (many years) and non-ambulatory since 09/2013.  His family are his primary caregivers.  He was recently treated for a UTI and completed levaquin on 5/17.  Family reports decreased PO intake over the prior two weeks and change in breathing noted am of presentation.  ER evaluation noted initial blood pressures to be normotensive but patient progressed to hypotension with systolic bp in the 12'W that responded to IVF, temp of 101.4, tachycardia into 140's, tachypnea requiring bipap.  UA notable for 3-6 WBC's (post abx).  Na 155, sr cr 2.77, glucose 135, troponin < 0.30, bnp 2283, lactic acid 4.2, wbc 17.6, MCV 100.7.  PCCM consulted for admission.    Wife denies new diarrhea, n/v, abdominal distention, sputum production.  Reports decreased intake, decreased alertness, tachypnea.    PAST MEDICAL HISTORY :  Past Medical History  Diagnosis Date  . Hypertension   . Cancer     colon cancer, hx of 2005  . Osteoarthritis   . Dysarthria   . Hypothyroidism   . History of CVA (cerebrovascular accident)   . Tremor   . History of skin cancer 2009   . UTI (urinary tract infection) 2009, 2011  . Psoriasis    Past Surgical History  Procedure Laterality Date  . Colectomy     Prior to Admission medications   Medication Sig Start Date End Date Taking? Authorizing Provider  acetaminophen (TYLENOL) 500 MG tablet Take 1,000 mg by mouth every 4 (four) hours as needed for mild pain.    Yes Historical Provider, MD  aspirin 81 MG EC tablet Take 81 mg by mouth daily.     Yes Historical Provider, MD  barrier cream (NON-SPECIFIED) CREA Apply 1 application topically 3 (three) times daily as needed.   Yes Historical Provider, MD  Cholecalciferol (VITAMIN D3) 1000 UNITS CAPS Take 1 capsule by mouth daily.     Yes Historical Provider, MD  levothyroxine (SYNTHROID, LEVOTHROID) 100 MCG tablet Take 100 mcg by mouth daily.   Yes Historical Provider, MD  tamsulosin (FLOMAX) 0.4 MG CAPS capsule Take 1 capsule (0.4 mg total) by mouth daily. 10/13/13  Yes Cassandria Anger, MD   No Known Allergies  FAMILY HISTORY:  Family History  Problem Relation Age of Onset  . Hypertension Other   . Hypertension Father    SOCIAL HISTORY:  reports that he has quit smoking. He does not have any smokeless tobacco history on file. He reports that he does not drink alcohol or use illicit drugs.  REVIEW OF SYSTEMS:  Patient is non-verbal at baseline and unable to write.  Information obtained from family at bedside and noted in HPI.    SUBJECTIVE:   VITAL SIGNS: Temp:  [101.4 F (38.6 C)] 101.4 F (38.6 C) (05/19 0842) Pulse Rate:  [113-160] 136 (05/19 1100) Resp:  [30-52] 39 (05/19 1100) BP: (79-126)/(54-76) 91/67 mmHg (05/19 1100) SpO2:  [88 %-100 %] 98 % (05/19 1100) FiO2 (%):  [30 %] 30 % (05/19 1000) Weight:  [158 lb 1.1 oz (71.7 kg)] 158 lb 1.1 oz (71.7 kg) (05/19 0945)  HEMODYNAMICS:    VENTILATOR SETTINGS: Vent Mode:  [-] BIPAP FiO2 (%):  [30 %] 30 %  INTAKE / OUTPUT: Intake/Output     05/18 0701 - 05/19 0700 05/19 0701 - 05/20 0700   Urine  (mL/kg/hr)  700   Total Output   700   Net   -700          PHYSICAL EXAMINATION: General:  wdwn adult male with significant kyphosis on bipap Neuro:  Opens eyes, tracks, resting tremor HEENT:  Mm pink/dry, bipap in place  Cardiovascular:  s1s2 rrr, no m/r/g Lungs:  Tachypnea, lungs bilaterally diminished  Abdomen:  Round/soft, bsx4 active Musculoskeletal:  No acute deformity Skin:  Warm/dry, no edema.  Stage II sacral ulcer on sacrum, broken skin with two quarter sized bilateral areas of purple, non-blanching pressure sites  LABS:  CBC  Recent Labs Lab 04/11/14 0855  WBC 17.6*  HGB 14.1  HCT 43.4  PLT 299   Coag's No results found for this basename: APTT, INR,  in the last 168 hours BMET  Recent Labs Lab 04/11/14 0855  NA 155*  K 4.0  CL 109  CO2 23  BUN 71*  CREATININE 2.77*  GLUCOSE 135*   Electrolytes  Recent Labs Lab 04/11/14 0855  CALCIUM 9.6   Sepsis Markers  Recent Labs Lab 04/11/14 0914  LATICACIDVEN 4.20*   ABG  Recent Labs Lab 04/11/14 0943  PHART 7.495*  PCO2ART 27.6*  PO2ART 83.0   Liver Enzymes No results found for this basename: AST, ALT, ALKPHOS, BILITOT, ALBUMIN,  in the last 168 hours Cardiac Enzymes  Recent Labs Lab 04/11/14 0855  TROPONINI <0.30  PROBNP 2283.0*   Glucose No results found for this basename: GLUCAP,  in the last 168 hours  Imaging Dg Chest Portable 1 View  04/11/2014   CLINICAL DATA:  73 -year-old male with shortness of Breath. Initial encounter.  EXAM: PORTABLE CHEST - 1 VIEW  COMPARISON:  02/19/2014 and earlier.  FINDINGS: Portable AP semi upright view at 0837 hrs. Continued low lung volumes. Stable cardiac size and mediastinal contours. No pneumothorax or pulmonary edema. Streaky opacity at both lung bases not significantly changed. No pleural effusion identified. Calcified atherosclerosis of the aorta again noted.  IMPRESSION: Stable low lung volumes.   Electronically Signed   By: Lars Pinks M.D.   On: 04/11/2014 08:52    ASSESSMENT / PLAN:  PULMONARY A: Acute Respiratory Distress - in setting of sepsis  Hx Aspiration  P:   -cautious use of bipap with hx of aspiration, if further decline in mental status, would d/c  -pulmonary hygiene as able -upright positioning -f/u cxr in am to ensure no infiltrate post hydration   CARDIOVASCULAR A:  Septic Shock  Atrial Fibrillation w RVR - no known hx of afib, likely secondary to sepsis  P:  -ensure adequate hydration  -tele monitoring  -if remains in afib, he is not a good candidate for anti-coagulation.  Will need to consider rate control if no  improvement with treatment of sepsis  -f/u EKG in am  -DNR, DNI, No vasopressors  -repeat lactic acid to assess clearance -if no improvement with volume + abx, consider other sources of fever (dvt/pe)  RENAL A:   Acute Kidney Injury  P:   -NS @125   GASTROINTESTINAL A:   Hx Aspiration  P:   -NPO -no indication for SUP  HEMATOLOGIC A:   Dehydration  Macrocytosis P:  -monitor CBC, likely volume depletion   INFECTIOUS A:   UTI - recent rx with levaquin, likely driving source sepsis  P:   -abx as above -blood & urine cultures  ENDOCRINE A:   Mild Hyperglycemia  P:   -monitor glucose on BMP  NEUROLOGIC A:   Parkinson's Disease Baseline Aphasia  Acute Encephalopathy  P:   -monitor, supportive care  GLOBAL Extensive discussion with family regarding goals of care & patients current clinical status.  They indicate he has had a downward decline since November of 2014.  Most recently, they have consider home Palliative Care but were not sure of how to make the referral.  He has baseline advanced parkinson's disease, hx of CVA, aphasia and has been bed bound since Nov 2014.  Family (wife and son) agree that they would not want CPR, Defib / Shock, central line or pressors.  They wish for full medical care up to the point of acute decline / imminent EOL.  If he  declines despite aggressive medical measures, they would want to progress to ward comfort care.  Will ask Palliative Care to assist with home referral.      Noe Gens, NP-C [facility] Pgr: [phone] or [phone]  Agree with above, Independently examined pt, evaluated data & formulated above care plan with NP who scribed this note & edited by me. Lactate clearance good sign, but overall prognosis quite guarded. Initiate palliation concurently  I have personally obtained a history, examined the patient, evaluated laboratory and imaging results, formulated the assessment and plan and placed orders.  CRITICAL CARE: The patient is critically ill with multiple organ systems failure and requires high complexity decision making for assessment and support, frequent evaluation and titration of therapies, application of advanced monitoring technologies and extensive interpretation of multiple databases. Critical Care Time devoted to patient care services described in this note is 60 minutes.   Kara Mead MD. [facility]. [facility] Pager [phone] If no response call [phone]   04/11/2014, 11:09 AM

## 2014-04-11 NOTE — ED Notes (Signed)
RT at bedside to set up Bipap.

## 2014-04-11 NOTE — ED Notes (Signed)
Pt is from home with history of parkinson's, tias, and normally he does not walk, talk, or sit up on his own.  Today family noted increased work of breathing and using some accessory muscles.  Ems reports initial sats of 88% RA, placed on cannula with no change in sats, inserted right NPA and placed on NRB and sats increased to 96%.  Pt had fever of 100 per ems.  Pt is ST on the monitor on arrival at 122.

## 2014-04-11 NOTE — ED Provider Notes (Signed)
CSN: [id number]     Arrival date & time 04/11/14  0809 History   First MD Initiated Contact with Patient 04/11/14 0818     Chief Complaint  Patient presents with  . Shortness of Breath     (Consider location/radiation/quality/duration/timing/severity/associated sxs/prior Treatment) HPI  80 year old male brought in by EMS from home. Patient less awake/responsive than he typically is. Patient has a history of Parkinson's. His baseline is that he is nonambulatory, essentially nonverbal and has difficulty sitting up by himself. "I think he can hear [place]." Today family noticed that patient was breathing fast and seemed to be less alert. Patient is nonverbal and cannot provide any history. No reported trauma. No vomiting or diarrhea. Pt on abx currently for UTI. Presumably levofloxacin. It's "L-E-V-O something."   Past Medical History  Diagnosis Date  . Hypertension   . Cancer     colon cancer, hx of 2005  . Osteoarthritis   . Dysarthria   . Hypothyroidism   . History of CVA (cerebrovascular accident)   . Tremor   . History of skin cancer 2009  . UTI (urinary tract infection) 2009, 2011  . Psoriasis    Past Surgical History  Procedure Laterality Date  . Colectomy     Family History  Problem Relation Age of Onset  . Hypertension Other   . Hypertension Father    History  Substance Use Topics  . Smoking status: Former Research scientist (life sciences)  . Smokeless tobacco: Not on file  . Alcohol Use: No    Review of Systems  Level 5 caveat because pt is nonverbal.   Allergies  Review of patient's allergies indicates no known allergies.  Home Medications   Prior to Admission medications   Medication Sig Start Date End Date Taking? Authorizing Provider  acetaminophen (TYLENOL) 500 MG tablet Take 1,000 mg by mouth every 4 (four) hours as needed for mild pain.    Yes Historical Provider, MD  aspirin 81 MG EC tablet Take 81 mg by mouth daily.     Yes Historical Provider, MD  barrier cream  (NON-SPECIFIED) CREA Apply 1 application topically 3 (three) times daily as needed.   Yes Historical Provider, MD  Cholecalciferol (VITAMIN D3) 1000 UNITS CAPS Take 1 capsule by mouth daily.     Yes Historical Provider, MD  levothyroxine (SYNTHROID, LEVOTHROID) 100 MCG tablet Take 100 mcg by mouth daily.   Yes Historical Provider, MD  tamsulosin (FLOMAX) 0.4 MG CAPS capsule Take 1 capsule (0.4 mg total) by mouth daily. 10/13/13  Yes Evie Lacks Plotnikov, MD   BP 83/54  Pulse 150  Temp(Src) 101.4 F (38.6 C) (Rectal)  Resp 30  SpO2 100% Physical Exam  Nursing note and vitals reviewed. Constitutional: He appears distressed.  HENT:  Head: Normocephalic and atraumatic.  Eyes: Conjunctivae are normal. Right eye exhibits no discharge. Left eye exhibits no discharge.  Neck: Neck supple.  Cardiovascular: Regular rhythm and normal heart sounds.  Exam reveals no gallop and no friction rub.   No murmur heard. Tachycardic. irreg   Pulmonary/Chest: He is in respiratory distress.  Breath sounds clear. Very tachypneic. Accessory muscle usage.   Abdominal: Soft. He exhibits no distension. There is no tenderness.  Musculoskeletal: He exhibits no edema and no tenderness.  Neurological:  Mostly laying in bed with head down/eyes closed. Did spontaneously open eyes briefly during exam. Resting tremor. Doesn't follow commands.   Skin: Skin is warm and dry.    ED Course  Procedures (including critical care time)  CRITICAL  CARE Performed by: Virgel Manifold  Total critical care time: 35 minutes  Critical care time was exclusive of separately billable procedures and treating other patients. Critical care was necessary to treat or prevent imminent or life-threatening deterioration. Critical care was time spent personally by me on the following activities: development of treatment plan with patient and/or surrogate as well as nursing, discussions with consultants, evaluation of patient's response to  treatment, examination of patient, obtaining history from patient or surrogate, ordering and performing treatments and interventions, ordering and review of laboratory studies, ordering and review of radiographic studies, pulse oximetry and re-evaluation of patient's condition.  Labs Review Labs Reviewed  CBC WITH DIFFERENTIAL - Abnormal; Notable for the following:    WBC 17.6 (*)    MCV 100.7 (*)    All other components within normal limits  BASIC METABOLIC PANEL - Abnormal; Notable for the following:    Sodium 155 (*)    Glucose, Bld 135 (*)    BUN 71 (*)    Creatinine, Ser 2.77 (*)    GFR calc non Af Amer 19 (*)    GFR calc Af Amer 22 (*)    All other components within normal limits  URINALYSIS, ROUTINE W REFLEX MICROSCOPIC - Abnormal; Notable for the following:    Leukocytes, UA SMALL (*)    All other components within normal limits  PRO B NATRIURETIC PEPTIDE - Abnormal; Notable for the following:    Pro B Natriuretic peptide (BNP) 2283.0 (*)    All other components within normal limits  URINE MICROSCOPIC-ADD ON - Abnormal; Notable for the following:    Bacteria, UA FEW (*)    Casts HYALINE CASTS (*)    All other components within normal limits  I-STAT CG4 LACTIC ACID, ED - Abnormal; Notable for the following:    Lactic Acid, Venous 4.20 (*)    All other components within normal limits  I-STAT ARTERIAL BLOOD GAS, ED - Abnormal; Notable for the following:    pH, Arterial 7.495 (*)    pCO2 arterial 27.6 (*)    All other components within normal limits  CULTURE, BLOOD (ROUTINE X 2)  CULTURE, BLOOD (ROUTINE X 2)  TROPONIN I  BLOOD GAS, ARTERIAL    Imaging Review Dg Chest Portable 1 View  04/11/2014   CLINICAL DATA:  46 -year-old male with shortness of Breath. Initial encounter.  EXAM: PORTABLE CHEST - 1 VIEW  COMPARISON:  02/19/2014 and earlier.  FINDINGS: Portable AP semi upright view at 0837 hrs. Continued low lung volumes. Stable cardiac size and mediastinal  contours. No pneumothorax or pulmonary edema. Streaky opacity at both lung bases not significantly changed. No pleural effusion identified. Calcified atherosclerosis of the aorta again noted.  IMPRESSION: Stable low lung volumes.   Electronically Signed   By: Lars Pinks M.D.   On: 04/11/2014 08:52     EKG Interpretation   Date/Time:  Tuesday Apr 11 2014 08:21:25 EDT Ventricular Rate:  116 PR Interval:  154 QRS Duration: 91 QT Interval:  330 QTC Calculation: 458 R Axis:   -36 Text Interpretation:  Sinus tachycardia Multiform ventricular premature  complexes Non-specific ST-t changes Confirmed by Uhlig Singer  MD, Laken Lobato ([id number])  on 04/11/2014 9:31:40 AM      MDM   Final diagnoses:  SIRS (systemic inflammatory response syndrome)  Encephalopathy  ARF (acute renal failure)  Dehydration  Atrial fibrillation with rapid ventricular response    80 year old male with likely sepsis. Empiric antibiotics were ordered. Additionally, atrial fibrillation with rapid  ventricular response. Deferred from rate controlling medications at this time. Will treat fever, give fluids and reassess.  Hypernatremia/elevated BUN/Cr consistent with dehydration. Likely from increased sensible losses with fever/tachypnea. Elevated lactic acid. Expect to improve with IVF. BP is. Remains in afib with RVR. Continued to defer from rate controlling meds with tenuous BP as I am more concerned that from infectious process. Source not clear. Potentially UTI. Cultures sent. Son and wife at bedside. Addressed code status. My impression with speaking with son/daughter that this hasn't been really spoken about before. Pt is critically ill and unfortunately functional status very limited at his baseline. Family having difficulty with making decision in terms of goals of care.     Virgel Manifold, MD 04/14/14 [id number]

## 2014-04-11 NOTE — Progress Notes (Addendum)
ANTIBIOTIC CONSULT NOTE - INITIAL  Pharmacy Consult for Vancomycin Indication: Sepsis  No Known Allergies  Patient Measurements:   Adjusted Body Weight: n/a  Vital Signs: Temp: 101.4 F (38.6 C) (05/19 0842) Temp src: Rectal (05/19 0842) BP: 101/57 mmHg (05/19 0935) Pulse Rate: 146 (05/19 0935) Intake/Output from previous day:   Intake/Output from this shift: Total I/O In: -  Out: 700 [Urine:700]  Labs:  Recent Labs  04/11/14 0855  WBC 17.6*  HGB 14.1  PLT 299  CREATININE 2.77*   The CrCl is unknown because both a height and weight (above a minimum accepted value) are required for this calculation. No results found for this basename: VANCOTROUGH, VANCOPEAK, VANCORANDOM, GENTTROUGH, GENTPEAK, GENTRANDOM, TOBRATROUGH, TOBRAPEAK, TOBRARND, AMIKACINPEAK, AMIKACINTROU, AMIKACIN,  in the last 72 hours   Microbiology: No results found for this or any previous visit (from the past 720 hour(s)).  Medical History: Past Medical History  Diagnosis Date  . Hypertension   . Cancer     colon cancer, hx of 2005  . Osteoarthritis   . Dysarthria   . Hypothyroidism   . History of CVA (cerebrovascular accident)   . Tremor   . History of skin cancer 2009  . UTI (urinary tract infection) 2009, 2011  . Psoriasis     Medications:   (Not in a hospital admission) Assessment: 82 YOM was brought to the ED less awake and responsive that usual. He is currently on abx for UTI - presumably Levaquin. WBC elevated at 17.6, Temp 101.4, LA 4.2. He was ruled septic and pharmacy was consulted to dose Vancomycin as empiric therapy. Zosyn has already been ordered in the ED. CrCl ~ 17 mL/min   5/19: Blood Cx x2>>   Goal of Therapy:  Vancomycin trough level 15-20 mcg/ml  Plan:  1) Give 1 dose of Vancomycin 1 gm IV. F/u random level in 2 days  2) Zosyn 3.375 gm IV Q 8 hours per MD 3) Monitor CBC, renal fx, cultures and patient's clinical progress   Albertina Parr, PharmD.  Clinical  Pharmacist Pager [phone]    Addendum: Adding zosyn for UTI and r/o pneumonia. Start patient on Zosyn 2.25 gm IV Q 8 hours.   Albertina Parr, PharmD.  Clinical Pharmacist Pager [phone]

## 2014-04-12 ENCOUNTER — Inpatient Hospital Stay (HOSPITAL_COMMUNITY): Payer: Medicare Other

## 2014-04-12 DIAGNOSIS — R1310 Dysphagia, unspecified: Secondary | ICD-10-CM

## 2014-04-12 DIAGNOSIS — R5383 Other fatigue: Secondary | ICD-10-CM

## 2014-04-12 DIAGNOSIS — Z515 Encounter for palliative care: Secondary | ICD-10-CM

## 2014-04-12 DIAGNOSIS — R531 Weakness: Secondary | ICD-10-CM | POA: Insufficient documentation

## 2014-04-12 DIAGNOSIS — R5381 Other malaise: Secondary | ICD-10-CM

## 2014-04-12 DIAGNOSIS — R05 Cough: Secondary | ICD-10-CM

## 2014-04-12 DIAGNOSIS — G9340 Encephalopathy, unspecified: Secondary | ICD-10-CM

## 2014-04-12 DIAGNOSIS — R059 Cough, unspecified: Secondary | ICD-10-CM

## 2014-04-12 DIAGNOSIS — R6510 Systemic inflammatory response syndrome (SIRS) of non-infectious origin without acute organ dysfunction: Secondary | ICD-10-CM

## 2014-04-12 LAB — GLUCOSE, CAPILLARY
GLUCOSE-CAPILLARY: 67 mg/dL — AB (ref 70–99)
GLUCOSE-CAPILLARY: 87 mg/dL (ref 70–99)
Glucose-Capillary: 77 mg/dL (ref 70–99)
Glucose-Capillary: 70 mg/dL (ref 70–99)
Glucose-Capillary: 87 mg/dL (ref 70–99)

## 2014-04-12 LAB — BASIC METABOLIC PANEL
BUN: 55 mg/dL — ABNORMAL HIGH (ref 6–23)
CALCIUM: 8 mg/dL — AB (ref 8.4–10.5)
CO2: 21 mEq/L (ref 19–32)
Chloride: 121 mEq/L — ABNORMAL HIGH (ref 96–112)
Creatinine, Ser: 1.66 mg/dL — ABNORMAL HIGH (ref 0.50–1.35)
GFR calc Af Amer: 41 mL/min — ABNORMAL LOW (ref >90)
GFR, EST NON AFRICAN AMERICAN: 35 mL/min — AB (ref >90)
Glucose, Bld: 84 mg/dL (ref 70–99)
Potassium: 2.7 mEq/L — CL (ref 3.7–5.3)
SODIUM: 156 meq/L — AB (ref 137–147)

## 2014-04-12 LAB — CBC
HCT: 35.8 % — ABNORMAL LOW (ref 39.0–52.0)
Hemoglobin: 11 g/dL — ABNORMAL LOW (ref 13.0–17.0)
MCH: 31.3 pg (ref 26.0–34.0)
MCHC: 30.7 g/dL (ref 30.0–36.0)
MCV: 102 fL — ABNORMAL HIGH (ref 78.0–100.0)
PLATELETS: 230 10*3/uL (ref 150–400)
RBC: 3.51 MIL/uL — ABNORMAL LOW (ref 4.22–5.81)
RDW: 15.3 % (ref 11.5–15.5)
WBC: 18.6 10*3/uL — ABNORMAL HIGH (ref 4.0–10.5)

## 2014-04-12 LAB — URINE CULTURE
COLONY COUNT: NO GROWTH
CULTURE: NO GROWTH

## 2014-04-12 LAB — MRSA PCR SCREENING: MRSA by PCR: NEGATIVE

## 2014-04-12 MED ORDER — PIPERACILLIN-TAZOBACTAM 3.375 G IVPB
3.3750 g | Freq: Three times a day (TID) | INTRAVENOUS | Status: DC
  Administered 2014-04-12 – 2014-04-13 (×4): 3.375 g via INTRAVENOUS
  Filled 2014-04-12 (×7): qty 50

## 2014-04-12 MED ORDER — BIOTENE DRY MOUTH MT LIQD
15.0000 mL | Freq: Two times a day (BID) | OROMUCOSAL | Status: DC
  Administered 2014-04-12 – 2014-04-18 (×12): 15 mL via OROMUCOSAL

## 2014-04-12 MED ORDER — POTASSIUM CHLORIDE 10 MEQ/100ML IV SOLN
10.0000 meq | INTRAVENOUS | Status: AC
  Administered 2014-04-12 (×4): 10 meq via INTRAVENOUS
  Filled 2014-04-12 (×4): qty 100

## 2014-04-12 MED ORDER — INSULIN ASPART 100 UNIT/ML ~~LOC~~ SOLN: 0.0000 [IU] | SUBCUTANEOUS | Status: DC

## 2014-04-12 MED ORDER — DEXTROSE 50 % IV SOLN
25.0000 mL | Freq: Once | INTRAVENOUS | Status: AC | PRN
  Administered 2014-04-12: 25 mL via INTRAVENOUS
  Filled 2014-04-12: qty 50

## 2014-04-12 MED ORDER — CHLORHEXIDINE GLUCONATE 0.12 % MT SOLN
15.0000 mL | Freq: Two times a day (BID) | OROMUCOSAL | Status: DC
  Administered 2014-04-12 – 2014-04-18 (×12): 15 mL via OROMUCOSAL
  Filled 2014-04-12 (×14): qty 15

## 2014-04-12 MED ORDER — DEXTROSE 5 % IV SOLN
INTRAVENOUS | Status: DC
  Administered 2014-04-12 – 2014-04-17 (×4): via INTRAVENOUS

## 2014-04-12 NOTE — Evaluation (Signed)
Clinical/Bedside Swallow Evaluation Patient Details  Name: [name] [name] MRN: [id number] Date of Birth: 11/18/1923  Today's Date: 04/12/2014 Time: 1355-1445 SLP Time Calculation (min): 50 min  Past Medical History:  Past Medical History  Diagnosis Date  . Hypertension   . Cancer     colon cancer, hx of 2005  . Osteoarthritis   . Dysarthria   . Hypothyroidism   . History of CVA (cerebrovascular accident)   . Tremor   . History of skin cancer 2009  . UTI (urinary tract infection) 2009, 2011, 2015  . Psoriasis    Past Surgical History:  Past Surgical History  Procedure Laterality Date  . Colectomy     HPI:  80 year old male admitted 04/11/14 due to sepsis related to UTI. PMH significant for Parkinson's disease, recurrent UTI, aspiration, CVA, nonverbal x5 years, decreased po intake.  CXR revealed BLL opacities vs atelectasis R>L.    Assessment / Plan / Recommendation Clinical Impression  Oral care completed with suction; pt cooperative. Minimal gag reflex elicited during oral care, but no vocalization.  Pt exhibited left anterior leakage following po trials, however, additional assessment of oral motor strength and function was unsuccessful due to com/cog deficits.  Pt was given small boluses of thin liquid and nectar thick liquid via 1/2tsp.  No swallow reflex was elicited, even allowing over 60 seconds after presentation.  Oral cavity was suctioned after bolus presentation when it became apparent that swallow reflex was not going to trigger.  Pt remain nonvocal throughout this evaluation. No overt s/s aspiration observed, no swallow reflex noted on palpation.  At this time, pt is deemed unsafe for po intake, and is currently not appropriate for objective study based on clinical presentation at bedside.  Given pt history, objective study would be beneficial in determining po appropriateness, once improvement is seen at bedside, which may occur with improvement of sequelae of UTI/sepsis.   The topic of non-oral feeding method was broached as an option that may be considered based on improvement in swallow function and safety.  ST to continue diagnostic treatment to determine appropriateness for po intake vs MBS.    Aspiration Risk  Severe    Diet Recommendation NPO   Medication Administration: Via alternative means    Other  Recommendations Oral Care Recommendations: Oral care Q4 per protocol;Staff/trained caregiver to provide oral care Other Recommendations: Have oral suction available   Follow Up Recommendations  24 hour supervision/assistance;Skilled Nursing facility    Frequency and Duration min 1 x/week  2 weeks   Pertinent Vitals/Pain VSS, no pain indicated    SLP Swallow Goals   Pt will exhibit po readiness   Swallow Study Prior Functional Status   Soft foods/ thin liquids prior to admit, although wife reports declining po intake. Daughter indicated chronic aspiration, although no objective study results found. Pt has history of CVA, has been nonvocal for several years, and has advanced Parkinson's disease, all of which increase risk of dysphagia/aspiration.    General Date of Onset: 04/12/14 HPI: 80 year old male admitted 04/11/14 due to sepsis related to UTI. PMH significant for Parkinson's disease, recurrent UTI, aspiration, CVA, nonverbal x5 years, decreased po intake.  CXR revealed BLL opacities vs atelectasis R>L.  Type of Study: Bedside swallow evaluation Previous Swallow Assessment: none found Diet Prior to this Study: NPO Temperature Spikes Noted: No Respiratory Status: Nasal cannula History of Recent Intubation: No Behavior/Cognition: Alert;Cooperative;Hard of hearing;Doesn't follow directions;Requires cueing Oral Cavity - Dentition: Adequate natural dentition Self-Feeding  Abilities: Total assist Patient Positioning: Upright in bed Baseline Vocal Quality: Aphonic Volitional Cough: Cognitively unable to elicit Volitional Swallow: Unable to  elicit    Oral/Motor/Sensory Function Overall Oral Motor/Sensory Function: Other (comment) (unable to fully assess, Left anterior leakage noted)   Ice Chips Ice chips: Not tested   Thin Liquid Thin Liquid: Impaired Presentation: Spoon Oral Phase Impairments: Reduced labial seal;Reduced lingual movement/coordination;Impaired anterior to posterior transit;Poor awareness of bolus Oral Phase Functional Implications: Oral holding Pharyngeal  Phase Impairments: Unable to trigger swallow    Nectar Thick Nectar Thick Liquid: Impaired Presentation: Spoon Oral Phase Impairments: Reduced labial seal;Reduced lingual movement/coordination;Impaired anterior to posterior transit;Poor awareness of bolus Oral phase functional implications: Left anterior spillage;Oral holding Pharyngeal Phase Impairments: Unable to trigger swallow   Honey Thick Honey Thick Liquid: Not tested   Puree Puree: Not tested   Solid   GO   [name] [name] [facility], CCC-SLP [phone] Solid: Not tested       [name] [name] 04/12/2014,3:06 PM

## 2014-04-12 NOTE — Progress Notes (Signed)
CRITICAL VALUE ALERT  Critical value received:  Potassium 2.7  Date of notification:  04/12/2014   Time of notification: 0551  Critical value read back: yes  Nurse who received alert:  Reatha Armour RN  MD notified (1st page):   Time of first page:  6:04 AM   MD notified (2nd page):  Time of second page:  Responding MD: Dr. Jimmy Footman  Time MD responded: [phone]

## 2014-04-12 NOTE — Progress Notes (Signed)
PULMONARY / CRITICAL CARE MEDICINE   Name: [name] [name] MRN: [id number] DOB: Dec 21, 1922    ADMISSION DATE:  04/11/2014 CONSULTATION DATE:    REFERRING MD :  Dr. Janice Singer PRIMARY SERVICE: PCCM   CHIEF COMPLAINT:  Sepsis   BRIEF PATIENT DESCRIPTION: 80 y/o M with PMH of advanced Parkinson's disease, recurrent UTI's & aspiration admitted 5/19 with sepsis thought related to UTI.    SIGNIFICANT EVENTS / STUDIES:  5/19 - admit with sepsis / urinary source   LINES / TUBES:   CULTURES: UC 5/19 >> BCx2 5/19 >>  ANTIBIOTICS: Vanco 5/19 >> Zosyn 5/19 >>     SUBJECTIVE:  Vitals stable.  No acute events overnight.  Off BiPAP, now on 4L [place].  VITAL SIGNS: Temp:  [97.2 F (36.2 C)-98.2 F (36.8 C)] 97.2 F (36.2 C) (05/20 0825) Pulse Rate:  [31-136] 109 (05/20 0730) Resp:  [24-41] 24 (05/20 0730) BP: (81-109)/(40-71) 109/57 mmHg (05/20 0730) SpO2:  [95 %-100 %] 100 % (05/20 0730) Weight:  [68.5 kg (151 lb 0.2 oz)] 68.5 kg (151 lb 0.2 oz) (05/20 0345)  HEMODYNAMICS:    VENTILATOR SETTINGS:    INTAKE / OUTPUT: Intake/Output     05/19 0701 - 05/20 0700 05/20 0701 - 05/21 0700   I.V. (mL/kg) 1200 (17.5)    IV Piggyback 50    Total Intake(mL/kg) 1250 (18.2)    Urine (mL/kg/hr) 700    Total Output 700     Net +550          Urine Occurrence 2 x    Stool Occurrence 4 x      PHYSICAL EXAMINATION: General:  Chronically ill appearing Neuro:  Opens eyes, tracks, resting tremor HEENT:  Mm pink/dry, Cardiovascular:  IRIR, tachy Lungs:  Tachypnea, lungs bilaterally diminished  Abdomen:   BS x 4, soft, NT/ND. Musculoskeletal:  No acute deformity Skin:  Warm/dry, no edema.  Stage II sacral ulcer on sacrum, broken skin with two quarter sized bilateral areas of purple, non-blanching pressure sites  LABS:  CBC  Recent Labs Lab 04/11/14 0855 04/12/14 0501  WBC 17.6* 18.6*  HGB 14.1 11.0*  HCT 43.4 35.8*  PLT 299 230   Coag's No results found for this basename: APTT,  INR,  in the last 168 hours BMET  Recent Labs Lab 04/11/14 0855 04/12/14 0501  NA 155* 156*  K 4.0 2.7*  CL 109 121*  CO2 23 21  BUN 71* 55*  CREATININE 2.77* 1.66*  GLUCOSE 135* 84   Electrolytes  Recent Labs Lab 04/11/14 0855 04/12/14 0501  CALCIUM 9.6 8.0*   Sepsis Markers  Recent Labs Lab 04/11/14 0914 04/11/14 1237  LATICACIDVEN 4.20* 2.0   ABG  Recent Labs Lab 04/11/14 0943  PHART 7.495*  PCO2ART 27.6*  PO2ART 83.0   Liver Enzymes No results found for this basename: AST, ALT, ALKPHOS, BILITOT, ALBUMIN,  in the last 168 hours Cardiac Enzymes  Recent Labs Lab 04/11/14 0855  TROPONINI <0.30  PROBNP 2283.0*   Glucose  Recent Labs Lab 04/11/14 2027  GLUCAP 99    Imaging Dg Chest Port 1 View  04/12/2014   CLINICAL DATA:  Shortness of breath and cough.  EXAM: PORTABLE CHEST - 1 VIEW  COMPARISON:  Chest x-ray 04/11/2014.  FINDINGS: Lung volumes are low. Bibasilar opacities (right greater than left) may reflect areas of atelectasis and/or consolidation. No evidence of pulmonary edema. No definite pleural effusions. Heart size is normal. Upper mediastinal contours are within normal limits. Atherosclerosis in the  thoracic aorta.  IMPRESSION: 1. Low lung volumes with bibasilar (right greater than left) opacities that are favored to represent atelectasis, although underlying airspace consolidation, particularly at the right base, is not excluded. Clinical correlation for signs and symptoms of infection or sequela of recent aspiration is recommended. 2. Atherosclerosis.   Electronically Signed   By: Vinnie Langton M.D.   On: 04/12/2014 06:22   Dg Chest Portable 1 View  04/11/2014   CLINICAL DATA:  53 -year-old male with shortness of Breath. Initial encounter.  EXAM: PORTABLE CHEST - 1 VIEW  COMPARISON:  02/19/2014 and earlier.  FINDINGS: Portable AP semi upright view at 0837 hrs. Continued low lung volumes. Stable cardiac size and mediastinal  contours. No pneumothorax or pulmonary edema. Streaky opacity at both lung bases not significantly changed. No pleural effusion identified. Calcified atherosclerosis of the aorta again noted.  IMPRESSION: Stable low lung volumes.   Electronically Signed   By: Lars Pinks M.D.   On: 04/11/2014 08:52    ASSESSMENT / PLAN:  PULMONARY A: Acute Respiratory Distress - in setting of sepsis.  Hx Aspiration  ? PNA P:   - Supplemental O2 as needed to maintain SpO2 > 92%. - If needed, cautious use of bipap with hx of aspiration, if any decline in mental status, then would not use. - Pulmonary hygiene. - Empiric abx. - BCx, UCx - narrow abx as cultures result.  CARDIOVASCULAR A:  Septic Shock  Atrial Fibrillation w RVR - no known hx of afib, likely secondary to sepsis  Elevated BNP DNR / DNI P:  - Continue hydration. - If remains in afib, he is not a good candidate for anti-coagulation.  Will need to consider rate control if no improvement with treatment of sepsis.  RENAL A:   Acute Kidney Injury - likely hypovolemia/hypotensionrelated.  Now improving s/p hydration.  Hypernatremia - Free water deficit ~ 4.7L Hypokalemia P:   - D/c NS, change to D5W @ 75 for gradual correction of Na (156 now, goal to reduce to 146 - 148 over next 24 hrs). - K x 4 runs. - BMP in AM.  GASTROINTESTINAL A:   NPO for now - Family would like pt to have diet though understand risk of aspiration. Hx Aspiration  P:   - SLP eval then can re-visit diet.   - No indication for SUP.  HEMATOLOGIC A:   Anemia - likely dilutional. P:  - CBC in AM.  INFECTIOUS A:   Septic Shock UTI - recent rx with levaquin, likely driving source sepsis. P:   - Abx as above. - BCx's / UCx's - narrow abx as cultures result. - Monitor fever curve / WBC's.  ENDOCRINE A:   At risk hyperglycemia while on D5W. P:   - CBG's q4h. - SSI.  NEUROLOGIC A:   Parkinson's Disease Baseline Aphasia  Acute Encephalopathy  P:    - Monitor, supportive care.  GLOBAL: Palliative care consulted 5/19.  Met with family 5/20, family have agreed that pt still DNR/DNI and we will continue with supportive care measures and treat sepsis as able to do without initiating life sustaining/life support measures.  PCCM will sign off, will transfer to [facility] starting 5/21, have notified NP for TRH.  Please do not hesitate to call back if we can be of any further assistance.   Montey Hora, PA - [facility] Pgr: [phone]  or [phone]  Maintain care as above, above  note edited in full, will transfer to [facility], PCCM will sign off, please call back if needed.  Patient seen and examined, agree with above note.  I dictated the care and orders written for this patient under my direction.  Rush Farmer, MD [phone]

## 2014-04-12 NOTE — Consult Note (Signed)
Patient [name] [name]      DOB: [birth year]      OHY:[id number]     Consult Note from the Palliative Medicine Team at [facility] Requested by: dr Elsworth Soho     PCP: Walker Kehr, MD Reason for Consultation:Clarification of [place] and options     Phone Number:[phone]  Assessment of patients Current state: Continued physical, functional and cognitive decline secondary to advanced  Parkinson's disease.  Now hospitalized with sepsis  Family faced with advanced directive decisions and anticipatory care needs  Consult is for review of medical treatment options, clarification of goals of care and end of life issues, disposition and options, and symptom recommendation.  This NP Wadie Lessen reviewed medical records, received report from team, assessed the patient and then meet at the patient's bedside along with his wife Lanny Lipkin, daughter Marliss Czar and son [name] to discuss diagnosis prognosis, GOC, EOL wishes disposition and options.  A detailed discussion was had today regarding advanced directives.  Concepts specific to code status, artifical feeding and hydration, continued IV antibiotics and rehospitalization was had.  The difference between a aggressive medical intervention path  and a palliative comfort care path for this patient at this time was had.  Values and goals of care important to patient and family were attempted to be elicited.  Concept of Hospice and Palliative Care were discussed  Natural trajectory and expectations at EOL were discussed.  Questions and concerns addressed.  Hard Choices booklet left for review. Family encouraged to call with questions or concerns.  PMT will continue to support holistically.   Goals of Care: 1.  Code Status:DNR/DNI-comfort is main focus of care   2. Scope of Treatment: Continue present  medical treatment plan,, family remain hopeful for improvement.  Family understand the overall poor prognosis but need more time to process  situation, comfort is a priority. Family to re-meet with this NP tomorrow at 2pm 3. Disposition:  Dependant on outcomes   4. Symptom Management:   1.  Dysphagia: Recommend bedside wallow study.  Discussed with family in detail, concept of aspiration and the risks and benefits of comforts feeds.  Risks and benefit of artifial feeding also discussed.    5. Psychosocial:   Emotional support offered to family at bedside     Patient Documents Completed or Given: Document Given Completed  Advanced Directives Pkt    MOST yes   DNR    Gone from My [facility]ight    Hard Choices yes     Brief HPI: Continued physical, functional and cognitive decline secondary to advanced  Parkinson's disease. Family reports patient is total care and has been non-ambulatory for 5 months.  Now hospitalized with sepsis.  Overall poor long term prognosis     ROS: unable to illicit due to decreased congition   PMH:  Past Medical History  Diagnosis Date  . Hypertension   . Cancer     colon cancer, hx of 2005  . Osteoarthritis   . Dysarthria   . Hypothyroidism   . History of CVA (cerebrovascular accident)   . Tremor   . History of skin cancer 2009  . UTI (urinary tract infection) 2009, 2011, 2015  . Psoriasis      PSH: Past Surgical History  Procedure Laterality Date  . Colectomy     I have reviewed the FH and SH and  If appropriate update it with new information. No Known Allergies Scheduled Meds: . heparin  5,000 Units Subcutaneous  3 times per day  . piperacillin-tazobactam (ZOSYN)  IV  3.375 g Intravenous Q8H   Continuous Infusions: . sodium chloride 125 mL/hr at 04/12/14 0550   PRN Meds:.sodium chloride    BP 109/57  Pulse 109  Temp(Src) 97.2 F (36.2 C) (Oral)  Resp 24  Ht 5\' 6"  (1.676 m)  Wt 68.5 kg (151 lb 0.2 oz)  BMI 24.39 kg/m2  SpO2 100%   PPS:30 % at best   Intake/Output Summary (Last 24 hours) at 04/12/14 1105 Last data filed at 04/12/14 0600  Gross per 24 hour   Intake   1250 ml  Output      0 ml  Net   1250 ml    Physical Exam:  General: chronically ill appearing, NAD HEENT:  Dry buccal membranes, no exudate Chest:   Decreased in bases, scattered coarse BS CVS: tachycardic Abdomen: soft NT +BS Ext: without edema Neuro: non verbal unable to follow commands  Labs: CBC    Component Value Date/Time   WBC 18.6* 04/12/2014 0501   WBC 8.8 11/01/2010 1407   RBC 3.51* 04/12/2014 0501   RBC 4.20 11/01/2010 1407   HGB 11.0* 04/12/2014 0501   HGB 14.0 11/01/2010 1407   HCT 35.8* 04/12/2014 0501   HCT 40.4 11/01/2010 1407   PLT 230 04/12/2014 0501   PLT 300 11/01/2010 1407   MCV 102.0* 04/12/2014 0501   MCV 96.1 11/01/2010 1407   MCH 31.3 04/12/2014 0501   MCH 33.4 11/01/2010 1407   MCHC 30.7 04/12/2014 0501   MCHC 34.8 11/01/2010 1407   RDW 15.3 04/12/2014 0501   RDW 13.5 11/01/2010 1407   LYMPHSABS 0.9 04/11/2014 0855   LYMPHSABS 2.4 11/01/2010 1407   MONOABS 2.1* 04/11/2014 0855   MONOABS 1.1* 11/01/2010 1407   EOSABS 0.0 04/11/2014 0855   EOSABS 0.2 11/01/2010 1407   BASOSABS 0.0 04/11/2014 0855   BASOSABS 0.0 11/01/2010 1407    BMET    Component Value Date/Time   NA 156* 04/12/2014 0501   K 2.7* 04/12/2014 0501   CL 121* 04/12/2014 0501   CO2 21 04/12/2014 0501   GLUCOSE 84 04/12/2014 0501   BUN 55* 04/12/2014 0501   CREATININE 1.66* 04/12/2014 0501   CALCIUM 8.0* 04/12/2014 0501   GFRNONAA 35* 04/12/2014 0501   GFRAA 41* 04/12/2014 0501    CMP     Component Value Date/Time   NA 156* 04/12/2014 0501   K 2.7* 04/12/2014 0501   CL 121* 04/12/2014 0501   CO2 21 04/12/2014 0501   GLUCOSE 84 04/12/2014 0501   BUN 55* 04/12/2014 0501   CREATININE 1.66* 04/12/2014 0501   CALCIUM 8.0* 04/12/2014 0501   PROT 7.1 02/01/2014 1152   ALBUMIN 3.8 02/01/2014 1152   AST 14 02/01/2014 1152   ALT 9 02/01/2014 1152   ALKPHOS 52 02/01/2014 1152   BILITOT 0.7 02/01/2014 1152   GFRNONAA 35* 04/12/2014 0501   GFRAA 41* 04/12/2014 0501    Time In Time Out Total Time Spent  with Patient Total Overall Time  1100 1230 75 min 90 min    Greater than 50%  of this time was spent counseling and coordinating care related to the above assessment and plan.   Wadie Lessen NP  Palliative Medicine Team Team Phone # [phone] Pager [phone]  Discussed with Montey Hora PA

## 2014-04-12 NOTE — Clinical Documentation Improvement (Signed)
Possible Clinical Conditions?  Acute Respiratory Failure Acute on Chronic Respiratory Failure Chronic Respiratory Failure Other Condition Cannot Clinically Determine   Supporting Information:  Risk Factors:   Admitted with Sepsis Chief Complaint: Shortness of breath H&P: Acute Respiratory Distress - in setting of sepsis  5/20 progress note: ? PNA  Signs & Symptoms: RR = 39  Diagnostics: 5/20 CXR: IMPRESSION: 1. Low lung volumes with bibasilar (right greater than left) opacities that are favored to represent atelectasis, although underlying airspace consolidation, particularly at the right base, is not excluded. Clinical correlation for signs and symptoms of infection or sequela of recent aspiration is recommended. 2. Atherosclerosis.   5/19 I Stat ABG PH  7.495 PCO2  27.6 PO2  83.0 HCO3 21  Treatment: -pulmonary hygiene as able  -upright positioning  -f/u cxr in am to ensure no infiltrate post hydration  BiPap Keep sats >92% Continuous Pulse Ox  Thank You, Estella Husk ,RN Clinical Documentation Specialist:  [facility]

## 2014-04-12 NOTE — Progress Notes (Signed)
Utilization Review Completed.  

## 2014-04-12 NOTE — Progress Notes (Signed)
Clinical Social Work Department BRIEF PSYCHOSOCIAL ASSESSMENT 04/12/2014  Patient:  [name] [name], [name] [name]     Account Number:  [id number]     Admit date:  04/11/2014  Clinical Social Worker:  Freeman Caldron  Date/Time:  04/12/2014 03:45 PM  Referred by:  Physician  Date Referred:  04/12/2014 Referred for  Other - See comment   Other Referral:   Hospice at home vs. inpatient hospice facility   Interview type:  Family Other interview type:    PSYCHOSOCIAL DATA Living Status:  FAMILY Admitted from facility:   Level of care:   Primary support name:  [name] [name] [phone]) Primary support relationship to patient:  SPOUSE Degree of support available:   Good--pt has support from son, wife, and sister-in-law.    CURRENT CONCERNS Current Concerns  Post-Acute Placement   Other Concerns:    SOCIAL WORK ASSESSMENT / PLAN Palliative NP had meeting with family today. CSW met with pt's son [name] [name] and provided support and explained role in discharge. Per NP note, pt has poor long-term prognosis. Explained to son that pt likely not candidate for hospice home placement. RNCM presented list of home hospice agencies that can assist with care at home. [name] [name] asked if CSW could follow up when pt's wife and sister-in-law are back in room; CSW will update family before leaving today.   Assessment/plan status:  Psychosocial Support/Ongoing Assessment of Needs Other assessment/ plan:   Information/referral to community resources:   Likely home with hospice    PATIENT'S/FAMILY'S RESPONSE TO PLAN OF CARE: Good--son participated in conversation with CSW and understands CSW role and home with hospice option. Family to have another meeting with palliative tomorrow afternoon.       [name] [name], MSW, [facility] Clinical Social Worker [phone]

## 2014-04-13 ENCOUNTER — Inpatient Hospital Stay (HOSPITAL_COMMUNITY): Payer: Medicare Other

## 2014-04-13 DIAGNOSIS — E860 Dehydration: Secondary | ICD-10-CM | POA: Diagnosis present

## 2014-04-13 DIAGNOSIS — R0609 Other forms of dyspnea: Secondary | ICD-10-CM

## 2014-04-13 DIAGNOSIS — E870 Hyperosmolality and hypernatremia: Secondary | ICD-10-CM

## 2014-04-13 DIAGNOSIS — I10 Essential (primary) hypertension: Secondary | ICD-10-CM

## 2014-04-13 DIAGNOSIS — G20 Parkinson's disease: Secondary | ICD-10-CM

## 2014-04-13 DIAGNOSIS — R0989 Other specified symptoms and signs involving the circulatory and respiratory systems: Secondary | ICD-10-CM

## 2014-04-13 DIAGNOSIS — I959 Hypotension, unspecified: Secondary | ICD-10-CM

## 2014-04-13 DIAGNOSIS — R627 Adult failure to thrive: Secondary | ICD-10-CM | POA: Diagnosis present

## 2014-04-13 DIAGNOSIS — R918 Other nonspecific abnormal finding of lung field: Secondary | ICD-10-CM

## 2014-04-13 DIAGNOSIS — E039 Hypothyroidism, unspecified: Secondary | ICD-10-CM

## 2014-04-13 DIAGNOSIS — I4891 Unspecified atrial fibrillation: Secondary | ICD-10-CM | POA: Diagnosis present

## 2014-04-13 LAB — GLUCOSE, CAPILLARY
GLUCOSE-CAPILLARY: 106 mg/dL — AB (ref 70–99)
GLUCOSE-CAPILLARY: 94 mg/dL (ref 70–99)
Glucose-Capillary: 99 mg/dL (ref 70–99)
Glucose-Capillary: 116 mg/dL — ABNORMAL HIGH (ref 70–99)
Glucose-Capillary: 87 mg/dL (ref 70–99)

## 2014-04-13 LAB — PHOSPHORUS: PHOSPHORUS: 2.2 mg/dL — AB (ref 2.3–4.6)

## 2014-04-13 LAB — CBC
HCT: 37.2 % — ABNORMAL LOW (ref 39.0–52.0)
Hemoglobin: 11.4 g/dL — ABNORMAL LOW (ref 13.0–17.0)
MCH: 31.5 pg (ref 26.0–34.0)
MCHC: 30.6 g/dL (ref 30.0–36.0)
MCV: 102.8 fL — ABNORMAL HIGH (ref 78.0–100.0)
Platelets: 253 10*3/uL (ref 150–400)
RBC: 3.62 MIL/uL — AB (ref 4.22–5.81)
RDW: 15.1 % (ref 11.5–15.5)
WBC: 15.6 10*3/uL — ABNORMAL HIGH (ref 4.0–10.5)

## 2014-04-13 LAB — BASIC METABOLIC PANEL
BUN: 44 mg/dL — ABNORMAL HIGH (ref 6–23)
CHLORIDE: 123 meq/L — AB (ref 96–112)
CO2: 21 mEq/L (ref 19–32)
Calcium: 8.7 mg/dL (ref 8.4–10.5)
Creatinine, Ser: 1.26 mg/dL (ref 0.50–1.35)
GFR calc Af Amer: 57 mL/min — ABNORMAL LOW (ref >90)
GFR, EST NON AFRICAN AMERICAN: 49 mL/min — AB (ref >90)
GLUCOSE: 101 mg/dL — AB (ref 70–99)
POTASSIUM: 3.1 meq/L — AB (ref 3.7–5.3)
Sodium: 159 mEq/L — ABNORMAL HIGH (ref 137–147)

## 2014-04-13 LAB — VANCOMYCIN, RANDOM

## 2014-04-13 LAB — MAGNESIUM: Magnesium: 2.6 mg/dL — ABNORMAL HIGH (ref 1.5–2.5)

## 2014-04-13 MED ORDER — SODIUM CHLORIDE 0.9 % IV BOLUS (SEPSIS)
500.0000 mL | Freq: Once | INTRAVENOUS | Status: AC
  Administered 2014-04-13: 500 mL via INTRAVENOUS

## 2014-04-13 MED ORDER — DEXTROSE 5 % IV SOLN
5.0000 mg/h | INTRAVENOUS | Status: DC
  Filled 2014-04-13: qty 100

## 2014-04-13 MED ORDER — LEVOTHYROXINE SODIUM 100 MCG IV SOLR
50.0000 ug | Freq: Every day | INTRAVENOUS | Status: DC
  Administered 2014-04-13: 50 ug via INTRAVENOUS
  Filled 2014-04-13 (×2): qty 5

## 2014-04-13 MED ORDER — POTASSIUM CHLORIDE 10 MEQ/100ML IV SOLN: 10.0000 meq | INTRAVENOUS | Status: DC

## 2014-04-13 MED ORDER — VANCOMYCIN HCL IN DEXTROSE 1-5 GM/200ML-% IV SOLN
1000.0000 mg | INTRAVENOUS | Status: DC
  Administered 2014-04-13: 1000 mg via INTRAVENOUS
  Filled 2014-04-13 (×2): qty 200

## 2014-04-13 MED ORDER — DIGOXIN 0.25 MG/ML IJ SOLN
0.2500 mg | Freq: Once | INTRAMUSCULAR | Status: AC
  Administered 2014-04-13: 0.25 mg via INTRAVENOUS
  Filled 2014-04-13: qty 1

## 2014-04-13 MED ORDER — MORPHINE SULFATE 2 MG/ML IJ SOLN
1.0000 mg | INTRAMUSCULAR | Status: DC | PRN
  Administered 2014-04-13 – 2014-04-18 (×14): 1 mg via INTRAVENOUS
  Filled 2014-04-13 (×14): qty 1

## 2014-04-13 MED ORDER — POTASSIUM CHLORIDE 10 MEQ/100ML IV SOLN
10.0000 meq | INTRAVENOUS | Status: AC
  Administered 2014-04-13 (×3): 10 meq via INTRAVENOUS
  Filled 2014-04-13: qty 100

## 2014-04-13 MED ORDER — LORAZEPAM 2 MG/ML IJ SOLN
1.0000 mg | INTRAMUSCULAR | Status: DC | PRN
  Administered 2014-04-15 – 2014-04-18 (×5): 1 mg via INTRAVENOUS
  Filled 2014-04-13 (×6): qty 1

## 2014-04-13 NOTE — Progress Notes (Signed)
ANTIBIOTIC CONSULT NOTE  Pharmacy Consult for Vancomycin Indication: Sepsis  No Known Allergies  Patient Measurements: Height: 5\' 6"  (167.6 cm) Weight: 152 lb 8.9 oz (69.2 kg) IBW/kg (Calculated) : 63.8  Vital Signs: Temp: 98.6 F (37 C) (05/21 0400) Temp src: Axillary (05/21 0400) BP: 108/57 mmHg (05/21 0400) Pulse Rate: 120 (05/21 0400) Intake/Output from previous day: 05/20 0701 - 05/21 0700 In: 775 [I.V.:675; IV Piggyback:100] Out: 825 [Urine:825] Intake/Output from this shift: Total I/O In: 725 [I.V.:675; IV Piggyback:50] Out: 400 [Urine:400]  Labs:  Recent Labs  04/11/14 0855 04/12/14 0501 04/13/14 0325  WBC 17.6* 18.6* 15.6*  HGB 14.1 11.0* 11.4*  PLT 299 230 253  CREATININE 2.77* 1.66*  --    Estimated Creatinine Clearance: 27.8 ml/min (by C-G formula based on Cr of 1.66).  Recent Labs  04/13/14 0325  VANCORANDOM <5.0     Assessment: 80 yo male with urosepsis for empiric antibiotics.    Goal of Therapy:  Vancomycin trough level 15-20 mcg/ml  Plan:  Vancomycin 1 g IV q24h  Phillis Knack, PharmD, BCPS

## 2014-04-13 NOTE — Progress Notes (Signed)
[name]  [name] [name] ZSW:[id number] DOB: December 27, 1922 DOA: 04/11/2014 PCP: Walker Kehr, MD  Time spent :  Brief narrative: 80 y/o M with PMH of HTN, Colon Cancer (2005), Osteoarthritis, Dysarthria (unable to speak for last 5 years), Hypothyroidism, CVA, Frequent UTI's and aspiration who presented to [facility] ER on 5/19 with family reporting "fast breathing & less alert". Patient at baseline is non-verbal (many years) and non-ambulatory since 09/2013. His family are his primary caregivers. He was recently treated for a UTI and completed levaquin on 5/17. Family reported decreased PO intake over the prior two weeks and change in breathing noted am of presentation.   ER evaluation noted initial blood pressures to be normotensive but patient progressed to hypotension with systolic bp in the 32'K that responded to IVF, temp of 101.4, tachycardia into 140's, tachypnea requiring bipap. UA notable for 3-6 WBC's (post abx). Na 155, sr cr 2.77, glucose 135, troponin < 0.30, bnp 2283, lactic acid 4.2, wbc 17.6, MCV 100.7. PCCM consulted for admission.   HPI/Subjective: Alert and non verbal at baseline x 5 years but facial expressions change appropriately when family enters room and interacts with this pt  Assessment/Plan: Active Problems:   Hypotension -etiology seems primarily due to [facility] -UA was abnormal and cx was negative so doubt sepsis    Dehydration with hypernatremia -Na 159 and BUN has decreased slightly to 44 -cont D5W but increase rate to 125/hr -because of RVR give 500 cc NS bolus x 2 -follow lytes    Pulmonary infiltrate in right lung on CXR -not hypoxic -suspect due to aspiration pneumonitis -already on Vanco and Zosyn for presumed UTI- cont for now    Atrial fibrillation with RVR -rates today 130-140s with low BP -fluid challenge as above since suspect high rate driven by [facility] -1x dose digoxin- can't use CCB or BB due to low BP ** 242 pm:  HR remained elevated and up to 130s despite above measures to restore volume so planned to try low dose Cardizem gtt but pt subsequently made Palliative so order dc'd    Parkinsonian syndrome/End stage -has been non ambulatory and has not spoken in >5 yrs -wife meeting with Palliative but remains conflicted over progressing to comfort measures -pt is alert and interacts and smiles appropriately with family/friends in room    Dysphagia, unspecified(787.20) -NPO for now SLP following    HYPOTHYROIDISM -resume Synthroid but give IV route    HYPERTENSION -low BP at this time    FTT (failure to thrive) in adult -Palliative following   DVT prophylaxis: Heparin SQ Code Status: DNR Family Communication: Wife at bedside Disposition Plan/Expected LOS: Transfer to Palliative unit   Consultants: Palliative Care  Procedures: None  Antibiotics: Vancomycin 5/19 >>> Zosyn 5/19 >>>  Objective: Blood pressure 90/58, pulse 131, temperature 97.9 F (36.6 C), temperature source Axillary, resp. rate 25, height 5\' 6"  (1.676 m), weight 152 lb 8.9 oz (69.2 kg), SpO2 97.00%.  Intake/Output Summary (Last 24 hours) at 04/13/14 1001 Last data filed at 04/13/14 0933  Gross per 24 hour  Intake   1610 ml  Output   1075 ml  Net    535 ml     Exam: General: No acute respiratory distress Lungs: Clear to auscultation bilaterally without wheezes or crackles, RA-diminished in bases Cardiovascular: Irregular, tachycardic rate and rhythm without murmur gallop or rub normal S1 and S2, no peripheral edema or JVD Abdomen: Nontender, nondistended, soft, bowel sounds  positive, no rebound, no ascites, no appreciable mass Musculoskeletal: No significant cyanosis, clubbing of bilateral lower extremities   Scheduled Meds:  Scheduled Meds: . antiseptic oral rinse  15 mL Mouth Rinse q12n4p  . chlorhexidine  15 mL Mouth Rinse BID  . digoxin  0.25 mg Intravenous Once  . heparin  5,000 Units Subcutaneous 3  times per day  . insulin aspart  0-9 Units Subcutaneous 6 times per day  . piperacillin-tazobactam (ZOSYN)  IV  3.375 g Intravenous Q8H  . potassium chloride  10 mEq Intravenous Q1 Hr x 3  . sodium chloride  500 mL Intravenous Once  . vancomycin  1,000 mg Intravenous Q24H   Continuous Infusions: . dextrose Stopped (04/13/14 0900)    Data Reviewed: Basic Metabolic Panel:  Recent Labs Lab 04/11/14 0855 04/12/14 0501 04/13/14 0325  NA 155* 156* 159*  K 4.0 2.7* 3.1*  CL 109 121* 123*  CO2 23 21 21   GLUCOSE 135* 84 101*  BUN 71* 55* 44*  CREATININE 2.77* 1.66* 1.26  CALCIUM 9.6 8.0* 8.7  MG  --   --  2.6*  PHOS  --   --  2.2*   Liver Function Tests: No results found for this basename: AST, ALT, ALKPHOS, BILITOT, PROT, ALBUMIN,  in the last 168 hours No results found for this basename: LIPASE, AMYLASE,  in the last 168 hours No results found for this basename: AMMONIA,  in the last 168 hours CBC:  Recent Labs Lab 04/11/14 0855 04/12/14 0501 04/13/14 0325  WBC 17.6* 18.6* 15.6*  NEUTROABS 14.6*  --   --   HGB 14.1 11.0* 11.4*  HCT 43.4 35.8* 37.2*  MCV 100.7* 102.0* 102.8*  PLT 299 230 253   Cardiac Enzymes:  Recent Labs Lab 04/11/14 0855  TROPONINI <0.30   BNP (last 3 results)  Recent Labs  04/11/14 0855  PROBNP 2283.0*   CBG:  Recent Labs Lab 04/12/14 1842 04/12/14 2046 04/13/14 0011 04/13/14 0418 04/13/14 0755  GLUCAP 87 87 94 99 116*    Recent Results (from the past 240 hour(s))  CULTURE, BLOOD (ROUTINE X 2)     Status: None   Collection Time    04/11/14  8:55 AM      Result Value Ref Range Status   Specimen Description BLOOD RIGHT ANTECUBITAL   Final   Special Requests BOTTLES DRAWN AEROBIC AND ANAEROBIC 5CC   Final   Culture  Setup Time     Final   Value: 04/11/2014 14:36     Performed at [facility]   Culture     Final   Value:        BLOOD CULTURE RECEIVED NO GROWTH TO DATE CULTURE WILL BE HELD FOR 5 DAYS BEFORE ISSUING A  FINAL NEGATIVE REPORT     Performed at [facility]   Report Status PENDING   Incomplete  URINE CULTURE     Status: None   Collection Time    04/11/14  8:59 AM      Result Value Ref Range Status   Specimen Description URINE, CATHETERIZED   Final   Special Requests NONE   Final   Culture  Setup Time     Final   Value: 04/11/2014 19:34     Performed at [facility]     Final   Value: NO GROWTH     Performed at [facility]   Culture     Final   Value:  NO GROWTH     Performed at [facility]   Report Status 04/12/2014 FINAL   Final  CULTURE, BLOOD (ROUTINE X 2)     Status: None   Collection Time    04/11/14  9:10 AM      Result Value Ref Range Status   Specimen Description BLOOD LEFT ANTECUBITAL   Final   Special Requests BOTTLES DRAWN AEROBIC AND ANAEROBIC 10CC   Final   Culture  Setup Time     Final   Value: 04/11/2014 14:36     Performed at [facility]   Culture     Final   Value:        BLOOD CULTURE RECEIVED NO GROWTH TO DATE CULTURE WILL BE HELD FOR 5 DAYS BEFORE ISSUING A FINAL NEGATIVE REPORT     Performed at [facility]   Report Status PENDING   Incomplete  MRSA PCR SCREENING     Status: None   Collection Time    04/11/14  9:35 PM      Result Value Ref Range Status   MRSA by PCR NEGATIVE  NEGATIVE Final   Comment:            The GeneXpert MRSA Assay (FDA     approved for NASAL specimens     only), is one component of a     comprehensive MRSA colonization     surveillance program. It is not     intended to diagnose MRSA     infection nor to guide or     monitor treatment for     MRSA infections.     Studies:  Recent x-ray studies have been reviewed in detail by the Attending Physician       Erin Hearing, ANP [facility] Office  [phone] Pager [phone]   **If unable to reach the above provider after paging please contact the [facility] [place] @ [phone]  On-Call/Text  Page:      [email or web address]      password [email or web address]  If 7PM-7AM, please contact night-coverage [email or web address] Password [email or web address] 04/13/2014, 10:01 AM   LOS: 2 days   Examining patient with ANP Ebony Hail, discussed assessment and plan and agree with plan. ADDENDUM; NP Wadie Lessen (palliative care) have spoken with family and they now want to move to full comfort care.NP Wadie Lessen (palliative care) has agreed to cancel all standing orders and the right orders for comfort care. Greater than 35 minutes was spent on direct patient care of this patient with multiple medical problems

## 2014-04-13 NOTE — Progress Notes (Signed)
Nutrition Brief Note  Patient identified on the </= 12 Braden score report. Patient's goal of care is comfort care. No nutrition interventions warranted at this time.  Please consult as needed.   Arthur Holms, RD, LDN Pager #: [phone] After-Hours Pager #: [phone]

## 2014-04-13 NOTE — Progress Notes (Signed)
ANTIBIOTIC CONSULT NOTE  Pharmacy Consult for Vancomycin Indication: Sepsis  No Known Allergies  Assessment: 80 yo male with urosepsis for empiric antibiotics.    Goal of Therapy:  Vancomycin trough level 15-20 mcg/ml  Plan:  Vancomycin 1 g IV q24h Resume Synthroid?   Thank you. Anette Guarneri, PharmD [phone]   Labs:  Recent Labs  04/11/14 0855 04/12/14 0501 04/13/14 0325  WBC 17.6* 18.6* 15.6*  HGB 14.1 11.0* 11.4*  PLT 299 230 253  CREATININE 2.77* 1.66* 1.26   Estimated Creatinine Clearance: 36.6 ml/min (by C-G formula based on Cr of 1.26).  Recent Labs  04/13/14 0325  VANCORANDOM <5.0     Thank you. Anette Guarneri, PharmD [phone]

## 2014-04-13 NOTE — Progress Notes (Signed)
Patient transferred from unit 2C to room 4N10 at 1815. Alert and in stable condition.

## 2014-04-13 NOTE — Progress Notes (Signed)
Progress Note from the Palliative Medicine Team at [place]:  -continued conversation with family regarding [place] and options  -plan is to shift to full comfort --dc telemetry, labs and diagnostics --comfort feeds as tolerated --minimized medications for comfort --symptom managment  Objective: No Known Allergies Scheduled Meds: . antiseptic oral rinse  15 mL Mouth Rinse q12n4p  . chlorhexidine  15 mL Mouth Rinse BID  . heparin  5,000 Units Subcutaneous 3 times per day  . insulin aspart  0-9 Units Subcutaneous 6 times per day  . levothyroxine  50 mcg Intravenous QAC breakfast  . piperacillin-tazobactam (ZOSYN)  IV  3.375 g Intravenous Q8H  . potassium chloride  10 mEq Intravenous NOW  . vancomycin  1,000 mg Intravenous Q24H   Continuous Infusions: . dextrose 125 mL/hr at 04/13/14 1100   PRN Meds:.sodium chloride  BP 109/78  Pulse 121  Temp(Src) 97.5 F (36.4 C) (Oral)  Resp 33  Ht 5\' 6"  (1.676 m)  Wt 69.2 kg (152 lb 8.9 oz)  BMI 24.64 kg/m2  SpO2 95%   PPS:20 %      Intake/Output Summary (Last 24 hours) at 04/13/14 1440 Last data filed at 04/13/14 1139  Gross per 24 hour  Intake   1735 ml  Output   1125 ml  Net    610 ml       Physical Exam:  General: ill appearing, NAD HEENT:  Dry buccal membranes  Chest:   Decreased in bases, scattered coarse BS, audible thrat secretions CVS: tachycardic Abdomen: soft NT +BS Ext: without edema Neuro: lethargic, unable to follow commands  Labs: CBC    Component Value Date/Time   WBC 15.6* 04/13/2014 0325   WBC 8.8 11/01/2010 1407   RBC 3.62* 04/13/2014 0325   RBC 4.20 11/01/2010 1407   HGB 11.4* 04/13/2014 0325   HGB 14.0 11/01/2010 1407   HCT 37.2* 04/13/2014 0325   HCT 40.4 11/01/2010 1407   PLT 253 04/13/2014 0325   PLT 300 11/01/2010 1407   MCV 102.8* 04/13/2014 0325   MCV 96.1 11/01/2010 1407   MCH 31.5 04/13/2014 0325   MCH 33.4 11/01/2010 1407   MCHC 30.6 04/13/2014 0325   MCHC 34.8 11/01/2010 1407   RDW 15.1 04/13/2014 0325   RDW 13.5 11/01/2010 1407   LYMPHSABS 0.9 04/11/2014 0855   LYMPHSABS 2.4 11/01/2010 1407   MONOABS 2.1* 04/11/2014 0855   MONOABS 1.1* 11/01/2010 1407   EOSABS 0.0 04/11/2014 0855   EOSABS 0.2 11/01/2010 1407   BASOSABS 0.0 04/11/2014 0855   BASOSABS 0.0 11/01/2010 1407    BMET    Component Value Date/Time   NA 159* 04/13/2014 0325   K 3.1* 04/13/2014 0325   CL 123* 04/13/2014 0325   CO2 21 04/13/2014 0325   GLUCOSE 101* 04/13/2014 0325   BUN 44* 04/13/2014 0325   CREATININE 1.26 04/13/2014 0325   CALCIUM 8.7 04/13/2014 0325   GFRNONAA 49* 04/13/2014 0325   GFRAA 57* 04/13/2014 0325    CMP     Component Value Date/Time   NA 159* 04/13/2014 0325   K 3.1* 04/13/2014 0325   CL 123* 04/13/2014 0325   CO2 21 04/13/2014 0325   GLUCOSE 101* 04/13/2014 0325   BUN 44* 04/13/2014 0325   CREATININE 1.26 04/13/2014 0325   CALCIUM 8.7 04/13/2014 0325   PROT 7.1 02/01/2014 1152   ALBUMIN 3.8 02/01/2014 1152   AST 14 02/01/2014 1152   ALT 9 02/01/2014 1152   ALKPHOS 52 02/01/2014 1152  BILITOT 0.7 02/01/2014 1152   GFRNONAA 49* 04/13/2014 0325   GFRAA 57* 04/13/2014 0325     Assessment and Plan: 1. Code Status:  DNR/DNI-comfort 2. Symptom Control: Pain/Dyspnea: Morphine 1 mg IV every 1 hr prn Agitation: Ativan 1 mg IV every 4 hrs prn Dysphagia: Comfort sips and chips as tolerated with known risk of aspiration 3. Psycho/Social: Emotional support offered to family at bedside, all understand the overall poor prognosis and are comfortable with comfort as the main focus of care 4. Disposition: Transition to med-surg unit, further disposition dependant on outcomes  Patient Documents Completed or Given: Document Given Completed  Advanced Directives Pkt    MOST yes   DNR    Gone from My Sight    Hard Choices yes     Time In Time Out Total Time Spent with Patient Total Overall Time  1300 1345 45 min 45 min    Greater than 50%  of this time was spent counseling and coordinating  care related to the above assessment and plan.  Wadie Lessen NP  Palliative Medicine Team Team Phone # [phone] Pager [phone]  Discussed with Dr Sherral Hammers 1

## 2014-04-13 NOTE — Progress Notes (Signed)
Transferred patient to 4N10 via bed, patient alert but not been talking which is his baseline. Family present at bedside with patient, oriented to room. Gave report to [employer].

## 2014-04-13 NOTE — Progress Notes (Signed)
Speech Language Pathology Treatment: Dysphagia  Patient Details Name: SEIYA SILSBY MRN: [id number] DOB: 05/29/1923 Today's Date: 04/13/2014 Time: 8921-1941 SLP Time Calculation (min): 26 min  Assessment / Plan / Recommendation Clinical Impression  Pt. Seen for oropharyngeal swallow function with wife at bedside.  Pt. Alert; xerostomia with oral care provided.  He manipulated puree consistency with partial oral transit and unable to initiate pharyngeal swallow with verbal and tactile cues (dry spoon) x 2.  Applesauce suctioned from oral cavity.  Wife with questions re: cause and prognosis.  Explained pt. Has multiple factors effecting swallow function which is progressive with Parkinson's and swallow function may improve if sepsis improves, however prognosis for swallow is unknown.  Palliative care note seen by SLP after session.  ST will follow.   HPI HPI: 80 year old male admitted 04/11/14 due to sepsis related to UTI. PMH significant for Parkinson's disease, recurrent UTI, aspiration, CVA, nonverbal x5 years, decreased po intake.  CXR revealed BLL opacities vs atelectasis R>L.    Pertinent Vitals Heart rate in upper 120's that wife says has been typical over past several days  SLP Plan  Continue with current plan of care    Recommendations Diet recommendations: NPO Medication Administration: Via alternative means              Oral Care Recommendations: Oral care Q4 per protocol;Staff/trained caregiver to provide oral care Follow up Recommendations: 24 hour supervision/assistance (possible SNF) Plan: Continue with current plan of care    GO     Houston Siren M.Ed [employer] [phone]  04/13/2014

## 2014-04-14 DIAGNOSIS — R0600 Dyspnea, unspecified: Secondary | ICD-10-CM

## 2014-04-14 MED ORDER — SCOPOLAMINE 1 MG/3DAYS TD PT72
1.0000 | MEDICATED_PATCH | TRANSDERMAL | Status: DC
Start: 2014-04-14 — End: 2014-04-18
  Administered 2014-04-14 – 2014-04-17 (×2): 1.5 mg via TRANSDERMAL
  Filled 2014-04-14 (×2): qty 1

## 2014-04-14 NOTE — Clinical Social Work Note (Signed)
Clinical Social Worker received follow up referral for residential Hospice placement.  CSW spoke to patient daughters at bedside who state that patient wife does not drive and would prefer placement in [place].  CSW contacted Harmon Pier with [facility] who confirmed no bed availability through Saturday.  CSW offered patient family additional choices.  Patient daughter calling patient son to communicate about alternatives and will update CSW this evening.  CSW remains available for support and to facilitate patient discharge planning needs.  Barbette Or, [place]

## 2014-04-14 NOTE — Progress Notes (Signed)
Patient Demographics  [name] [name], is a 80 y.o. male, DOB - [birth year], JJK:[id number]  Admit date - 04/11/2014   Admitting Physician Rigoberto Noel, MD  Outpatient Primary MD for the patient is Walker Kehr, MD  LOS - 3   Chief Complaint  Patient presents with  . Shortness of Breath       Brief narrative:   80 y/o M with PMH of HTN, Colon Cancer (2005), Osteoarthritis, Dysarthria (unable to speak for last 5 years), Hypothyroidism, CVA, Frequent UTI's and aspiration who presented to [facility] ER on 5/19 with family reporting "fast breathing & less alert". Patient at baseline is non-verbal (many years) and non-ambulatory since 09/2013. His family are his primary caregivers. He was recently treated for a UTI and completed levaquin on 5/17. Family reported decreased PO intake over the prior two weeks and change in breathing noted am of presentation.   ER evaluation noted initial blood pressures to be normotensive but patient progressed to hypotension with systolic bp in the 37'J that responded to IVF, temp of 101.4, tachycardia into 140's, tachypnea requiring bipap. UA notable for 3-6 WBC's (post abx). Na 155, sr cr 2.77, glucose 135, troponin < 0.30, bnp 2283, lactic acid 4.2, wbc 17.6, MCV 100.7. PCCM consulted for admission. After initial aggressive care it was decided that patient will be best served with full comfort care, palliative care has been called and is now been transitioned to full comfort care with DO NOT RESUSCITATE.    Assessment & Plan    Patient with history of advanced Parkinson's disease, underlying dysphagia with recurrent aspiration, hypothyroidism, essential hypertension, failure to thrive, Atrial fibrillation. Initially admitted for sepsis due to aspiration pneumonia along with  dehydration and hypotension.   Initially treated with IV fluids, IV antibiotics along with oxygen and nebulizer treatments. Now after detailed discussion with family members which include son and wife patient is Transitioned to full comfort care under the guidance of palliative care. Goal now is comfort only. If he survived beyond the next few days we will look for inpatient hospice placement. All aggressive treatment including antibiotics, lab work etc. have been stopped.      Code Status: DO NOT RESUSCITATE  Family Communication: Wife and son bedside  Disposition Plan: Possible residential hospice   Procedures   Consults  palliative care   Medications  Scheduled Meds: . antiseptic oral rinse  15 mL Mouth Rinse q12n4p  . chlorhexidine  15 mL Mouth Rinse BID   Continuous Infusions: . dextrose 10 mL/hr at 04/13/14 1819   PRN Meds:.sodium chloride, LORazepam, morphine injection  DVT Prophylaxis  SCD  Lab Results  Component Value Date   PLT 253 04/13/2014    Antibiotics     Anti-infectives   Start     Dose/Rate Route Frequency Ordered Stop   04/13/14 0600  vancomycin (VANCOCIN) IVPB 1000 mg/200 mL premix  Status:  Discontinued     1,000 mg 200 mL/hr over 60 Minutes Intravenous Every 24 hours 04/13/14 0517 04/13/14 1622   04/12/14 1000  piperacillin-tazobactam (ZOSYN) IVPB 3.375 g  Status:  Discontinued     3.375 g 12.5 mL/hr over 240 Minutes Intravenous Every 8 hours 04/12/14 0853 04/13/14 1622   04/11/14 1800  piperacillin-tazobactam (ZOSYN) IVPB  2.25 g  Status:  Discontinued     2.25 g 100 mL/hr over 30 Minutes Intravenous Every 8 hours 04/11/14 1308 04/12/14 0853   04/11/14 1100  vancomycin (VANCOCIN) IVPB 1000 mg/200 mL premix     1,000 mg 200 mL/hr over 60 Minutes Intravenous  Once 04/11/14 1009 04/11/14 1215   04/11/14 1000  piperacillin-tazobactam (ZOSYN) IVPB 3.375 g     3.375 g 100 mL/hr over 30 Minutes Intravenous  Once 04/11/14 [id number] 04/11/14 1030           Subjective:   [name] [name] today is in bed resting comfortably, does not appear to be in distress.  Objective:   Filed Vitals:   04/13/14 1539 04/13/14 2104 04/14/14 0210 04/14/14 0549  BP: 83/54 110/65 110/65 107/59  Pulse: 119 115 84 85  Temp: 98.5 F (36.9 C) 97.4 F (36.3 C) 97.6 F (36.4 C) 98.3 F (36.8 C)  TempSrc: Oral Oral Oral Oral  Resp: 28 22 22 24   Height:      Weight:      SpO2: 98% 92% 94% 94%    Wt Readings from Last 3 Encounters:  04/13/14 69.2 kg (152 lb 8.9 oz)  03/02/14 71.668 kg (158 lb)  02/01/14 68.493 kg (151 lb)     Intake/Output Summary (Last 24 hours) at 04/14/14 0918 Last data filed at 04/14/14 0045  Gross per 24 hour  Intake    225 ml  Output    700 ml  Net   -475 ml     Physical Exam  In bed in no discomfort resting comfortably Supple Neck,No JVD, No cervical lymphadenopathy appriciated.  Symmetrical Chest wall movement, Good air movement bilaterally, coarse bilateral breath sounds RRR,No Gallops,Rubs or new Murmurs, No Parasternal Heave +ve B.Sounds, Abd Soft, Non tender, No organomegaly appriciated, No rebound - guarding or rigidity. Wearing a condom catheter No Cyanosis, Clubbing or edema, No new Rash or bruise      Data Review   Micro Results Recent Results (from the past 240 hour(s))  CULTURE, BLOOD (ROUTINE X 2)     Status: None   Collection Time    04/11/14  8:55 AM      Result Value Ref Range Status   Specimen Description BLOOD RIGHT ANTECUBITAL   Final   Special Requests BOTTLES DRAWN AEROBIC AND ANAEROBIC 5CC   Final   Culture  Setup Time     Final   Value: 04/11/2014 14:36     Performed at [facility]   Culture     Final   Value:        BLOOD CULTURE RECEIVED NO GROWTH TO DATE CULTURE WILL BE HELD FOR 5 DAYS BEFORE ISSUING A FINAL NEGATIVE REPORT     Performed at [facility]   Report Status PENDING   Incomplete  URINE CULTURE     Status: None   Collection Time    04/11/14  8:59 AM       Result Value Ref Range Status   Specimen Description URINE, CATHETERIZED   Final   Special Requests NONE   Final   Culture  Setup Time     Final   Value: 04/11/2014 19:34     Performed at [place]     Final   Value: NO GROWTH     Performed at [facility]   Culture     Final   Value: NO GROWTH     Performed at [facility]   Report Status 04/12/2014 FINAL   Final  CULTURE, BLOOD (ROUTINE X 2)     Status: None   Collection Time    04/11/14  9:10 AM      Result Value Ref Range Status   Specimen Description BLOOD LEFT ANTECUBITAL   Final   Special Requests BOTTLES DRAWN AEROBIC AND ANAEROBIC 10CC   Final   Culture  Setup Time     Final   Value: 04/11/2014 14:36     Performed at [facility]   Culture     Final   Value:        BLOOD CULTURE RECEIVED NO GROWTH TO DATE CULTURE WILL BE HELD FOR 5 DAYS BEFORE ISSUING A FINAL NEGATIVE REPORT     Performed at [facility]   Report Status PENDING   Incomplete  MRSA PCR SCREENING     Status: None   Collection Time    04/11/14  9:35 PM      Result Value Ref Range Status   MRSA by PCR NEGATIVE  NEGATIVE Final   Comment:            The GeneXpert MRSA Assay (FDA     approved for NASAL specimens     only), is one component of a     comprehensive MRSA colonization     surveillance program. It is not     intended to diagnose MRSA     infection nor to guide or     monitor treatment for     MRSA infections.    Radiology Reports Dg Chest Port 1 View  04/13/2014   CLINICAL DATA:  Cough, shortness of breath.  EXAM: PORTABLE CHEST - 1 VIEW  COMPARISON:  DG CHEST 1V PORT dated 04/12/2014  FINDINGS: The cardiac silhouette appears moderately enlarged, similar. Tortuous calcified aorta. Similar right pulmonary hilar airspace opacity with patchy right lower lobe airspace opacity, elevated right hemidiaphragm. Strandy densities in the lung bases bilaterally. Central pulmonary vasculature  congestion. No pleural effusions. No pneumothorax.  Soft tissue planes and included osseous structures are nonsuspicious, mild degenerative change of thoracic spine. Severe degenerative change of left shoulder. Multiple EKG lines overlie the patient and may obscure subtle underlying pathology.  IMPRESSION: Stable cardiomegaly, central pulmonary vasculature congestion with right perihilar and right lung base airspace opacities in a background of bibasilar atelectasis.   Electronically Signed   By: Elon Alas   On: 04/13/2014 06:29   Dg Chest Port 1 View  04/12/2014   CLINICAL DATA:  Shortness of breath and cough.  EXAM: PORTABLE CHEST - 1 VIEW  COMPARISON:  Chest x-ray 04/11/2014.  FINDINGS: Lung volumes are low. Bibasilar opacities (right greater than left) may reflect areas of atelectasis and/or consolidation. No evidence of pulmonary edema. No definite pleural effusions. Heart size is normal. Upper mediastinal contours are within normal limits. Atherosclerosis in the thoracic aorta.  IMPRESSION: 1. Low lung volumes with bibasilar (right greater than left) opacities that are favored to represent atelectasis, although underlying airspace consolidation, particularly at the right base, is not excluded. Clinical correlation for signs and symptoms of infection or sequela of recent aspiration is recommended. 2. Atherosclerosis.   Electronically Signed   By: Vinnie Langton M.D.   On: 04/12/2014 06:22   Dg Chest Portable 1 View  04/11/2014   CLINICAL DATA:  64 -year-old male with shortness of Breath. Initial encounter.  EXAM: PORTABLE CHEST - 1 VIEW  COMPARISON:  02/19/2014 and earlier.  FINDINGS: Portable AP semi upright view at 0837 hrs. Continued low lung volumes. Stable cardiac size and mediastinal contours. No pneumothorax or pulmonary edema. Streaky opacity at both lung bases not significantly changed. No pleural effusion identified. Calcified atherosclerosis of the aorta again noted.   IMPRESSION: Stable low lung volumes.   Electronically Signed   By: Lars Pinks M.D.   On: 04/11/2014 08:52    CBC  Recent Labs Lab 04/11/14 0855 04/12/14 0501 04/13/14 0325  WBC 17.6* 18.6* 15.6*  HGB 14.1 11.0* 11.4*  HCT 43.4 35.8* 37.2*  PLT 299 230 253  MCV 100.7* 102.0* 102.8*  MCH 32.7 31.3 31.5  MCHC 32.5 30.7 30.6  RDW 15.0 15.3 15.1  LYMPHSABS 0.9  --   --   MONOABS 2.1*  --   --   EOSABS 0.0  --   --   BASOSABS 0.0  --   --     Chemistries   Recent Labs Lab 04/11/14 0855 04/12/14 0501 04/13/14 0325  NA 155* 156* 159*  K 4.0 2.7* 3.1*  CL 109 121* 123*  CO2 23 21 21   GLUCOSE 135* 84 101*  BUN 71* 55* 44*  CREATININE 2.77* 1.66* 1.26  CALCIUM 9.6 8.0* 8.7  MG  --   --  2.6*   ------------------------------------------------------------------------------------------------------------------ estimated creatinine clearance is 36.6 ml/min (by C-G formula based on Cr of 1.26). ------------------------------------------------------------------------------------------------------------------ No results found for this basename: HGBA1C,  in the last 72 hours ------------------------------------------------------------------------------------------------------------------ No results found for this basename: CHOL, HDL, LDLCALC, TRIG, CHOLHDL, LDLDIRECT,  in the last 72 hours ------------------------------------------------------------------------------------------------------------------ No results found for this basename: TSH, T4TOTAL, FREET3, T3FREE, THYROIDAB,  in the last 72 hours ------------------------------------------------------------------------------------------------------------------ No results found for this basename: VITAMINB12, FOLATE, FERRITIN, TIBC, IRON, RETICCTPCT,  in the last 72 hours  Coagulation profile No results found for this basename: INR, PROTIME,  in the last 168 hours  No results found for this basename: DDIMER,  in the last 72  hours  Cardiac Enzymes  Recent Labs Lab 04/11/14 0855  TROPONINI <0.30   ------------------------------------------------------------------------------------------------------------------ No components found with this basename: POCBNP,      Time Spent in minutes  35   Thurnell Lose M.D on 04/14/2014 at 9:18 AM  Between 7am to 7pm - Pager - [phone]  After 7pm go to [email or web address] - password [email or web address]  And look for the night coverage person covering for me after hours   [facility] Office  [phone]   **Disclaimer: This note may have been dictated with voice recognition software. Similar sounding words can inadvertently be transcribed and this note may contain transcription errors which may not have been corrected upon publication of note.**

## 2014-04-14 NOTE — Progress Notes (Signed)
Progress Note from the Palliative Medicine Team at [place]:  -continued conversation with family regarding [place] and options  -all are hoping for comfort, quality and dignity; discussed with nursing improtance of utilizing prn medications to maximize comfort  -continued discussion regarding natural trajectory and expectations at EOL   Objective: No Known Allergies Scheduled Meds: . antiseptic oral rinse  15 mL Mouth Rinse q12n4p  . chlorhexidine  15 mL Mouth Rinse BID   Continuous Infusions: . dextrose 10 mL/hr at 04/13/14 1819   PRN Meds:.sodium chloride, LORazepam, morphine injection  BP 120/78  Pulse 115  Temp(Src) 98 F (36.7 C) (Oral)  Resp 22  Ht 5\' 6"  (1.676 m)  Wt 71.124 kg (156 lb 12.8 oz)  BMI 25.32 kg/m2  SpO2 97%   PPS:20 %      Intake/Output Summary (Last 24 hours) at 04/14/14 1139 Last data filed at 04/14/14 0045  Gross per 24 hour  Intake      0 ml  Output    650 ml  Net   -650 ml       Physical Exam:  General: ill appearing, NAD HEENT:  Dry buccal membranes  Chest:   Decreased in bases, scattered coarse BS, audible throat secretions CVS: tachycardic Abdomen: soft NT +BS Ext: without edema Neuro: lethargic, unable to follow commands  Labs: CBC    Component Value Date/Time   WBC 15.6* 04/13/2014 0325   WBC 8.8 11/01/2010 1407   RBC 3.62* 04/13/2014 0325   RBC 4.20 11/01/2010 1407   HGB 11.4* 04/13/2014 0325   HGB 14.0 11/01/2010 1407   HCT 37.2* 04/13/2014 0325   HCT 40.4 11/01/2010 1407   PLT 253 04/13/2014 0325   PLT 300 11/01/2010 1407   MCV 102.8* 04/13/2014 0325   MCV 96.1 11/01/2010 1407   MCH 31.5 04/13/2014 0325   MCH 33.4 11/01/2010 1407   MCHC 30.6 04/13/2014 0325   MCHC 34.8 11/01/2010 1407   RDW 15.1 04/13/2014 0325   RDW 13.5 11/01/2010 1407   LYMPHSABS 0.9 04/11/2014 0855   LYMPHSABS 2.4 11/01/2010 1407   MONOABS 2.1* 04/11/2014 0855   MONOABS 1.1* 11/01/2010 1407   EOSABS 0.0 04/11/2014 0855   EOSABS 0.2 11/01/2010  1407   BASOSABS 0.0 04/11/2014 0855   BASOSABS 0.0 11/01/2010 1407    BMET    Component Value Date/Time   NA 159* 04/13/2014 0325   K 3.1* 04/13/2014 0325   CL 123* 04/13/2014 0325   CO2 21 04/13/2014 0325   GLUCOSE 101* 04/13/2014 0325   BUN 44* 04/13/2014 0325   CREATININE 1.26 04/13/2014 0325   CALCIUM 8.7 04/13/2014 0325   GFRNONAA 49* 04/13/2014 0325   GFRAA 57* 04/13/2014 0325    CMP     Component Value Date/Time   NA 159* 04/13/2014 0325   K 3.1* 04/13/2014 0325   CL 123* 04/13/2014 0325   CO2 21 04/13/2014 0325   GLUCOSE 101* 04/13/2014 0325   BUN 44* 04/13/2014 0325   CREATININE 1.26 04/13/2014 0325   CALCIUM 8.7 04/13/2014 0325   PROT 7.1 02/01/2014 1152   ALBUMIN 3.8 02/01/2014 1152   AST 14 02/01/2014 1152   ALT 9 02/01/2014 1152   ALKPHOS 52 02/01/2014 1152   BILITOT 0.7 02/01/2014 1152   GFRNONAA 49* 04/13/2014 0325   GFRAA 57* 04/13/2014 0325     Assessment and Plan: 1. Code Status:  DNR/DNI-comfort 2. Symptom Control: Pain/Dyspnea: Morphine 1 mg IV every 1 hr prn Agitation: Ativan 1  mg IV every 4 hrs prn Dysphagia: Comfort sips and chips as tolerated with known risk of aspiration Terminal secretions: Scopolamine patch 3. Psycho/Social: Emotional support offered to family at bedside, all understand the overall poor prognosis and are comfortable with comfort as the main focus of care 4.  Disposition: Long conversation with family regarding likely prognosis of days, are open to residential hospice if disposition becomes necessary.   Family is having a hard time grasping the need for any transition form the hospital.   Patient Documents Completed or Given: Document Given Completed  Advanced Directives Pkt    MOST yes   DNR    Gone from My Sight    Hard Choices yes     Time In Time Out Total Time Spent with Patient Total Overall Time  0900 0935 35 min 35 min    Greater than 50%  of this time was spent counseling and coordinating care related to the above assessment  and plan.  Wadie Lessen NP  Palliative Medicine Team Team Phone # [phone] Pager [phone]  Discussed with CMRN and SW 1

## 2014-04-14 NOTE — Care Management Note (Unsigned)
    Page 1 of 2   04/17/2014     11:09:49 AM CARE MANAGEMENT NOTE 04/17/2014  Patient:  [name], [name]   Account Number:  [id number]  Date Initiated:  04/12/2014  Documentation initiated by:  MAYO,HENRIETTA  Subjective/Objective Assessment:   dx sepsis; lives with family    PCP Lew Dawes     Action/Plan:   Anticipated DC Date:     Anticipated DC Plan:    In-house referral  Clinical Social Worker      DC Planning Services  CM consult      Choice offered to / List presented to:             Status of service:   Medicare Important Message given?  YES (If response is "NO", the following Medicare IM given date fields will be blank) Date Medicare IM given:   Date Additional Medicare IM given:  04/17/2014  Discharge Disposition:    Per UR Regulation:  Reviewed for med. necessity/level of care/duration of stay  If discussed at [place] of Stay Meetings, dates discussed:    Comments:  04/17/14 [place] RN, MSN, CM- met with patient's wife to provide Medicare IM letter.   04/14/14  Lorne Skeens RN, MSN, CM- Spoke with Wadie Lessen NP with Palliative Care to clarify discharge disposition.  Per [facility] Kidney, patient's wife will be unable to take him home with hospice and residential hospice needs to be arranged for discharge.  CSW notified.  04/13/14 [place] MSN BSN CCM Discussed home hospice services with spouse.  She is still processing information - another palliative care mtg is scheduled for this afternoon.  04/12/14 1500 Henrietta Mayo RN MSN BSN CCM CM and CSW met with son, [name] [name], following palliative care meeting per request to discuss residential hospice vs home with hospice.  Pt has basically required total care for some time and son interested in residential hospice but per attending, pt probably does not have prognosis to qualify. Discussed home hospice services and provided list of agencies; he will discuss with other family members.

## 2014-04-14 NOTE — Progress Notes (Signed)
CSW provided pt's wife/family members with residential hospice list.  Family to discuss choices and will call CSW this pm so referral can be made.

## 2014-04-14 NOTE — Progress Notes (Signed)
Provided handoff to CSW covering 4N today. Plan is for pt to discharge home with hospice. CSW signing off and can be re-consulted if CSW needs arise.   Ky Barban, MSW, [facility] Clinical Social Worker [phone]

## 2014-04-15 NOTE — Progress Notes (Signed)
Patient Demographics  [name] [name], is a 80 y.o. male, DOB - Nov 15, 1923, BPZ:[id number]  Admit date - 04/11/2014   Admitting Physician Rigoberto Noel, MD  Outpatient Primary MD for the patient is Walker Kehr, MD  LOS - 4   Chief Complaint  Patient presents with  . Shortness of Breath       Brief narrative:   80 y/o M with PMH of HTN, Colon Cancer (2005), Osteoarthritis, Dysarthria (unable to speak for last 5 years), Hypothyroidism, CVA, Frequent UTI's and aspiration who presented to [facility] ER on 5/19 with family reporting "fast breathing & less alert". Patient at baseline is non-verbal (many years) and non-ambulatory since 09/2013. His family are his primary caregivers. He was recently treated for a UTI and completed levaquin on 5/17. Family reported decreased PO intake over the prior two weeks and change in breathing noted am of presentation.   ER evaluation noted initial blood pressures to be normotensive but patient progressed to hypotension with systolic bp in the 24'M that responded to IVF, temp of 101.4, tachycardia into 140's, tachypnea requiring bipap. UA notable for 3-6 WBC's (post abx). Na 155, sr cr 2.77, glucose 135, troponin < 0.30, bnp 2283, lactic acid 4.2, wbc 17.6, MCV 100.7. PCCM consulted for admission. After initial aggressive care it was decided that patient will be best served with full comfort care, palliative care has been called and is now been transitioned to full comfort care with DO NOT RESUSCITATE.    Assessment & Plan    Patient with history of advanced Parkinson's disease, underlying dysphagia with recurrent aspiration, hypothyroidism, essential hypertension, failure to thrive, Atrial fibrillation. Initially admitted for sepsis due to aspiration pneumonia along with  dehydration and hypotension.   Initially treated with IV fluids, IV antibiotics along with oxygen and nebulizer treatments. Now after detailed discussion with family members which include son and wife patient is Transitioned to full comfort care under the guidance of palliative care. Goal now is comfort only. If he survived beyond the next few days we will look for inpatient hospice placement. All aggressive treatment including antibiotics, lab work etc. have been stopped. Goal of care strictly comfort.      Code Status: DO NOT RESUSCITATE  Family Communication: Wife and son bedside on 04/14/2014, 04/15/2014  Disposition Plan: Possible residential hospice   Procedures   Consults  palliative care   Medications  Scheduled Meds: . antiseptic oral rinse  15 mL Mouth Rinse q12n4p  . chlorhexidine  15 mL Mouth Rinse BID  . scopolamine  1 patch Transdermal Q72H   Continuous Infusions: . dextrose 10 mL/hr at 04/13/14 1819   PRN Meds:.sodium chloride, LORazepam, morphine injection  DVT Prophylaxis  SCD  Lab Results  Component Value Date   PLT 253 04/13/2014    Antibiotics     Anti-infectives   Start     Dose/Rate Route Frequency Ordered Stop   04/13/14 0600  vancomycin (VANCOCIN) IVPB 1000 mg/200 mL premix  Status:  Discontinued     1,000 mg 200 mL/hr over 60 Minutes Intravenous Every 24 hours 04/13/14 0517 04/13/14 1622   04/12/14 1000  piperacillin-tazobactam (ZOSYN) IVPB 3.375 g  Status:  Discontinued     3.375 g 12.5 mL/hr over 240 Minutes  Intravenous Every 8 hours 04/12/14 0853 04/13/14 1622   04/11/14 1800  piperacillin-tazobactam (ZOSYN) IVPB 2.25 g  Status:  Discontinued     2.25 g 100 mL/hr over 30 Minutes Intravenous Every 8 hours 04/11/14 1308 04/12/14 0853   04/11/14 1100  vancomycin (VANCOCIN) IVPB 1000 mg/200 mL premix     1,000 mg 200 mL/hr over 60 Minutes Intravenous  Once 04/11/14 1009 04/11/14 1215   04/11/14 1000  piperacillin-tazobactam (ZOSYN) IVPB  3.375 g     3.375 g 100 mL/hr over 30 Minutes Intravenous  Once 04/11/14 8185 04/11/14 1030          Subjective:   [name] [name] today is in bed resting comfortably, does not appear to be in distress.  Objective:   Filed Vitals:   04/14/14 1043 04/14/14 2158 04/15/14 0500 04/15/14 0513  BP: 120/78 115/62  161/73  Pulse: 115 91  88  Temp: 98 F (36.7 C) 98.2 F (36.8 C)  99.6 F (37.6 C)  TempSrc: Oral Oral  Axillary  Resp: 22 22  24   Height:      Weight:   71.668 kg (158 lb)   SpO2: 97% 95%  95%    Wt Readings from Last 3 Encounters:  04/15/14 71.668 kg (158 lb)  03/02/14 71.668 kg (158 lb)  02/01/14 68.493 kg (151 lb)     Intake/Output Summary (Last 24 hours) at 04/15/14 0913 Last data filed at 04/15/14 0500  Gross per 24 hour  Intake      0 ml  Output   1200 ml  Net  -1200 ml     Physical Exam  In bed in no discomfort resting comfortably Supple Neck,No JVD, No cervical lymphadenopathy appriciated.  Symmetrical Chest wall movement, Good air movement bilaterally, coarse bilateral breath sounds RRR,No Gallops,Rubs or new Murmurs, No Parasternal Heave +ve B.Sounds, Abd Soft, Non tender, No organomegaly appriciated, No rebound - guarding or rigidity. Wearing a condom catheter No Cyanosis, Clubbing or edema, No new Rash or bruise      Data Review   Micro Results Recent Results (from the past 240 hour(s))  CULTURE, BLOOD (ROUTINE X 2)     Status: None   Collection Time    04/11/14  8:55 AM      Result Value Ref Range Status   Specimen Description BLOOD RIGHT ANTECUBITAL   Final   Special Requests BOTTLES DRAWN AEROBIC AND ANAEROBIC 5CC   Final   Culture  Setup Time     Final   Value: 04/11/2014 14:36     Performed at [facility]   Culture     Final   Value:        BLOOD CULTURE RECEIVED NO GROWTH TO DATE CULTURE WILL BE HELD FOR 5 DAYS BEFORE ISSUING A FINAL NEGATIVE REPORT     Performed at [facility]   Report Status PENDING    Incomplete  URINE CULTURE     Status: None   Collection Time    04/11/14  8:59 AM      Result Value Ref Range Status   Specimen Description URINE, CATHETERIZED   Final   Special Requests NONE   Final   Culture  Setup Time     Final   Value: 04/11/2014 19:34     Performed at [place]     Final   Value: NO GROWTH     Performed at [facility]  Final   Value: NO GROWTH     Performed at [facility]   Report Status 04/12/2014 FINAL   Final  CULTURE, BLOOD (ROUTINE X 2)     Status: None   Collection Time    04/11/14  9:10 AM      Result Value Ref Range Status   Specimen Description BLOOD LEFT ANTECUBITAL   Final   Special Requests BOTTLES DRAWN AEROBIC AND ANAEROBIC 10CC   Final   Culture  Setup Time     Final   Value: 04/11/2014 14:36     Performed at [facility]   Culture     Final   Value:        BLOOD CULTURE RECEIVED NO GROWTH TO DATE CULTURE WILL BE HELD FOR 5 DAYS BEFORE ISSUING A FINAL NEGATIVE REPORT     Performed at [facility]   Report Status PENDING   Incomplete  MRSA PCR SCREENING     Status: None   Collection Time    04/11/14  9:35 PM      Result Value Ref Range Status   MRSA by PCR NEGATIVE  NEGATIVE Final   Comment:            The GeneXpert MRSA Assay (FDA     approved for NASAL specimens     only), is one component of a     comprehensive MRSA colonization     surveillance program. It is not     intended to diagnose MRSA     infection nor to guide or     monitor treatment for     MRSA infections.    Radiology Reports Dg Chest Port 1 View  04/13/2014   CLINICAL DATA:  Cough, shortness of breath.  EXAM: PORTABLE CHEST - 1 VIEW  COMPARISON:  DG CHEST 1V PORT dated 04/12/2014  FINDINGS: The cardiac silhouette appears moderately enlarged, similar. Tortuous calcified aorta. Similar right pulmonary hilar airspace opacity with patchy right lower lobe airspace opacity, elevated right  hemidiaphragm. Strandy densities in the lung bases bilaterally. Central pulmonary vasculature congestion. No pleural effusions. No pneumothorax.  Soft tissue planes and included osseous structures are nonsuspicious, mild degenerative change of thoracic spine. Severe degenerative change of left shoulder. Multiple EKG lines overlie the patient and may obscure subtle underlying pathology.  IMPRESSION: Stable cardiomegaly, central pulmonary vasculature congestion with right perihilar and right lung base airspace opacities in a background of bibasilar atelectasis.   Electronically Signed   By: Elon Alas   On: 04/13/2014 06:29   Dg Chest Port 1 View  04/12/2014   CLINICAL DATA:  Shortness of breath and cough.  EXAM: PORTABLE CHEST - 1 VIEW  COMPARISON:  Chest x-ray 04/11/2014.  FINDINGS: Lung volumes are low. Bibasilar opacities (right greater than left) may reflect areas of atelectasis and/or consolidation. No evidence of pulmonary edema. No definite pleural effusions. Heart size is normal. Upper mediastinal contours are within normal limits. Atherosclerosis in the thoracic aorta.  IMPRESSION: 1. Low lung volumes with bibasilar (right greater than left) opacities that are favored to represent atelectasis, although underlying airspace consolidation, particularly at the right base, is not excluded. Clinical correlation for signs and symptoms of infection or sequela of recent aspiration is recommended. 2. Atherosclerosis.   Electronically Signed   By: Vinnie Langton M.D.   On: 04/12/2014 06:22   Dg Chest Portable 1 View  04/11/2014   CLINICAL DATA:  39 -year-old male with shortness of Breath. Initial encounter.  EXAM: PORTABLE CHEST - 1 VIEW  COMPARISON:  02/19/2014 and earlier.  FINDINGS: Portable AP semi upright view at 0837 hrs. Continued low lung volumes. Stable cardiac size and mediastinal contours. No pneumothorax or pulmonary edema. Streaky opacity at both lung bases not significantly  changed. No pleural effusion identified. Calcified atherosclerosis of the aorta again noted.  IMPRESSION: Stable low lung volumes.   Electronically Signed   By: Lars Pinks M.D.   On: 04/11/2014 08:52    CBC  Recent Labs Lab 04/11/14 0855 04/12/14 0501 04/13/14 0325  WBC 17.6* 18.6* 15.6*  HGB 14.1 11.0* 11.4*  HCT 43.4 35.8* 37.2*  PLT 299 230 253  MCV 100.7* 102.0* 102.8*  MCH 32.7 31.3 31.5  MCHC 32.5 30.7 30.6  RDW 15.0 15.3 15.1  LYMPHSABS 0.9  --   --   MONOABS 2.1*  --   --   EOSABS 0.0  --   --   BASOSABS 0.0  --   --     Chemistries   Recent Labs Lab 04/11/14 0855 04/12/14 0501 04/13/14 0325  NA 155* 156* 159*  K 4.0 2.7* 3.1*  CL 109 121* 123*  CO2 23 21 21   GLUCOSE 135* 84 101*  BUN 71* 55* 44*  CREATININE 2.77* 1.66* 1.26  CALCIUM 9.6 8.0* 8.7  MG  --   --  2.6*   ------------------------------------------------------------------------------------------------------------------ estimated creatinine clearance is 36.6 ml/min (by C-G formula based on Cr of 1.26). ------------------------------------------------------------------------------------------------------------------ No results found for this basename: HGBA1C,  in the last 72 hours ------------------------------------------------------------------------------------------------------------------ No results found for this basename: CHOL, HDL, LDLCALC, TRIG, CHOLHDL, LDLDIRECT,  in the last 72 hours ------------------------------------------------------------------------------------------------------------------ No results found for this basename: TSH, T4TOTAL, FREET3, T3FREE, THYROIDAB,  in the last 72 hours ------------------------------------------------------------------------------------------------------------------ No results found for this basename: VITAMINB12, FOLATE, FERRITIN, TIBC, IRON, RETICCTPCT,  in the last 72 hours  Coagulation profile No results found for this basename: INR, PROTIME,   in the last 168 hours  No results found for this basename: DDIMER,  in the last 72 hours  Cardiac Enzymes  Recent Labs Lab 04/11/14 0855  TROPONINI <0.30   ------------------------------------------------------------------------------------------------------------------ No components found with this basename: POCBNP,      Time Spent in minutes  35   Thurnell Lose M.D on 04/15/2014 at 9:13 AM  Between 7am to 7pm - Pager - [phone]  After 7pm go to [email or web address] - password [email or web address]  And look for the night coverage person covering for me after hours   [facility] Office  [phone]   **Disclaimer: This note may have been dictated with voice recognition software. Similar sounding words can inadvertently be transcribed and this note may contain transcription errors which may not have been corrected upon publication of note.**

## 2014-04-16 NOTE — Progress Notes (Signed)
Clinical Social Worker (CSW) contacted patient's wife and left message to inquire about hospice facilities. CSW contacted patient's son who reported that they are still looking into the facilities and their first choice would be [facility]. CSW made referral to [facility].   Blima Rich, [place] Weekend CSW [phone]

## 2014-04-16 NOTE — Progress Notes (Signed)
Patient Demographics  [name] [name], is a 80 y.o. male, DOB - 1923-01-27, KNL:[id number]  Admit date - 04/11/2014   Admitting Physician Rigoberto Noel, MD  Outpatient Primary MD for the patient is Walker Kehr, MD  LOS - 5   Chief Complaint  Patient presents with  . Shortness of Breath       Brief narrative:   80 y/o M with PMH of HTN, Colon Cancer (2005), Osteoarthritis, Dysarthria (unable to speak for last 5 years), Hypothyroidism, CVA, Frequent UTI's and aspiration who presented to [facility] ER on 5/19 with family reporting "fast breathing & less alert". Patient at baseline is non-verbal (many years) and non-ambulatory since 09/2013. His family are his primary caregivers. He was recently treated for a UTI and completed levaquin on 5/17. Family reported decreased PO intake over the prior two weeks and change in breathing noted am of presentation.   ER evaluation noted initial blood pressures to be normotensive but patient progressed to hypotension with systolic bp in the 79'K that responded to IVF, temp of 101.4, tachycardia into 140's, tachypnea requiring bipap. UA notable for 3-6 WBC's (post abx). Na 155, sr cr 2.77, glucose 135, troponin < 0.30, bnp 2283, lactic acid 4.2, wbc 17.6, MCV 100.7. PCCM consulted for admission. After initial aggressive care it was decided that patient will be best served with full comfort care, palliative care has been called and is now been transitioned to full comfort care with DO NOT RESUSCITATE.    Assessment & Plan    Patient with history of advanced Parkinson's disease, underlying dysphagia with recurrent aspiration, hypothyroidism, essential hypertension, failure to thrive, Atrial fibrillation. Initially admitted for sepsis due to aspiration pneumonia along with  dehydration and hypotension.   Initially treated with IV fluids, IV antibiotics along with oxygen and nebulizer treatments. Now after detailed discussion with family members which include son and wife patient is Transitioned to full comfort care under the guidance of palliative care. Goal now is comfort only. If he survived beyond the next few days we will look for inpatient hospice placement. All aggressive treatment including antibiotics, lab work etc. have been stopped. Goal of care strictly comfort. Most likely will require residential hospice placement discussed with wife and son at site.      Code Status: DO NOT RESUSCITATE  Family Communication: Wife and son bedside on 04/14/2014, 04/15/2014  Disposition Plan: Possible residential hospice   Procedures    Consults  palliative care   Medications  Scheduled Meds: . antiseptic oral rinse  15 mL Mouth Rinse q12n4p  . chlorhexidine  15 mL Mouth Rinse BID  . scopolamine  1 patch Transdermal Q72H   Continuous Infusions: . dextrose 10 mL/hr at 04/13/14 1819   PRN Meds:.sodium chloride, LORazepam, morphine injection  DVT Prophylaxis  SCD  Lab Results  Component Value Date   PLT 253 04/13/2014    Antibiotics     Anti-infectives   Start     Dose/Rate Route Frequency Ordered Stop   04/13/14 0600  vancomycin (VANCOCIN) IVPB 1000 mg/200 mL premix  Status:  Discontinued     1,000 mg 200 mL/hr over 60 Minutes Intravenous Every 24 hours 04/13/14 0517 04/13/14 1622   04/12/14 1000  piperacillin-tazobactam (ZOSYN) IVPB 3.375 g  Status:  Discontinued     3.375 g 12.5 mL/hr over 240 Minutes Intravenous Every 8 hours 04/12/14 0853 04/13/14 1622   04/11/14 1800  piperacillin-tazobactam (ZOSYN) IVPB 2.25 g  Status:  Discontinued     2.25 g 100 mL/hr over 30 Minutes Intravenous Every 8 hours 04/11/14 1308 04/12/14 0853   04/11/14 1100  vancomycin (VANCOCIN) IVPB 1000 mg/200 mL premix     1,000 mg 200 mL/hr over 60 Minutes  Intravenous  Once 04/11/14 1009 04/11/14 1215   04/11/14 1000  piperacillin-tazobactam (ZOSYN) IVPB 3.375 g     3.375 g 100 mL/hr over 30 Minutes Intravenous  Once 04/11/14 8315 04/11/14 1030          Subjective:   Primus Bravo today is in bed resting comfortably, does not appear to be in distress.  Objective:   Filed Vitals:   04/15/14 1423 04/15/14 2122 04/16/14 0500 04/16/14 0613  BP: 119/65 131/79  133/84  Pulse: 98 93  90  Temp: 98.6 F (37 C) 97.8 F (36.6 C)  98.6 F (37 C)  TempSrc: Axillary Oral  Oral  Resp: 22 22  22   Height:      Weight:   71.487 kg (157 lb 9.6 oz)   SpO2: 95% 98%  91%    Wt Readings from Last 3 Encounters:  04/16/14 71.487 kg (157 lb 9.6 oz)  03/02/14 71.668 kg (158 lb)  02/01/14 68.493 kg (151 lb)     Intake/Output Summary (Last 24 hours) at 04/16/14 0902 Last data filed at 04/15/14 1700  Gross per 24 hour  Intake      0 ml  Output    400 ml  Net   -400 ml     Physical Exam  In bed in no discomfort resting comfortably Supple Neck,No JVD, No cervical lymphadenopathy appriciated.  Symmetrical Chest wall movement, Good air movement bilaterally, coarse bilateral breath sounds RRR,No Gallops,Rubs or new Murmurs, No Parasternal Heave +ve B.Sounds, Abd Soft, Non tender, No organomegaly appriciated, No rebound - guarding or rigidity. Wearing a condom catheter No Cyanosis, Clubbing or edema, No new Rash or bruise      Data Review   Micro Results Recent Results (from the past 240 hour(s))  CULTURE, BLOOD (ROUTINE X 2)     Status: None   Collection Time    04/11/14  8:55 AM      Result Value Ref Range Status   Specimen Description BLOOD RIGHT ANTECUBITAL   Final   Special Requests BOTTLES DRAWN AEROBIC AND ANAEROBIC 5CC   Final   Culture  Setup Time     Final   Value: 04/11/2014 14:36     Performed at [facility]   Culture     Final   Value:        BLOOD CULTURE RECEIVED NO GROWTH TO DATE CULTURE WILL BE HELD FOR 5 DAYS  BEFORE ISSUING A FINAL NEGATIVE REPORT     Performed at [facility]   Report Status PENDING   Incomplete  URINE CULTURE     Status: None   Collection Time    04/11/14  8:59 AM      Result Value Ref Range Status   Specimen Description URINE, CATHETERIZED   Final   Special Requests NONE   Final   Culture  Setup Time     Final   Value: 04/11/2014 19:34     Performed at [facility]     Final  Value: NO GROWTH     Performed at [facility]     Final   Value: NO GROWTH     Performed at [facility]   Report Status 04/12/2014 FINAL   Final  CULTURE, BLOOD (ROUTINE X 2)     Status: None   Collection Time    04/11/14  9:10 AM      Result Value Ref Range Status   Specimen Description BLOOD LEFT ANTECUBITAL   Final   Special Requests BOTTLES DRAWN AEROBIC AND ANAEROBIC 10CC   Final   Culture  Setup Time     Final   Value: 04/11/2014 14:36     Performed at [facility]   Culture     Final   Value:        BLOOD CULTURE RECEIVED NO GROWTH TO DATE CULTURE WILL BE HELD FOR 5 DAYS BEFORE ISSUING A FINAL NEGATIVE REPORT     Performed at [facility]   Report Status PENDING   Incomplete  MRSA PCR SCREENING     Status: None   Collection Time    04/11/14  9:35 PM      Result Value Ref Range Status   MRSA by PCR NEGATIVE  NEGATIVE Final   Comment:            The GeneXpert MRSA Assay (FDA     approved for NASAL specimens     only), is one component of a     comprehensive MRSA colonization     surveillance program. It is not     intended to diagnose MRSA     infection nor to guide or     monitor treatment for     MRSA infections.    Radiology Reports Dg Chest Port 1 View  04/13/2014   CLINICAL DATA:  Cough, shortness of breath.  EXAM: PORTABLE CHEST - 1 VIEW  COMPARISON:  DG CHEST 1V PORT dated 04/12/2014  FINDINGS: The cardiac silhouette appears moderately enlarged, similar. Tortuous calcified aorta. Similar right  pulmonary hilar airspace opacity with patchy right lower lobe airspace opacity, elevated right hemidiaphragm. Strandy densities in the lung bases bilaterally. Central pulmonary vasculature congestion. No pleural effusions. No pneumothorax.  Soft tissue planes and included osseous structures are nonsuspicious, mild degenerative change of thoracic spine. Severe degenerative change of left shoulder. Multiple EKG lines overlie the patient and may obscure subtle underlying pathology.  IMPRESSION: Stable cardiomegaly, central pulmonary vasculature congestion with right perihilar and right lung base airspace opacities in a background of bibasilar atelectasis.   Electronically Signed   By: Elon Alas   On: 04/13/2014 06:29   Dg Chest Port 1 View  04/12/2014   CLINICAL DATA:  Shortness of breath and cough.  EXAM: PORTABLE CHEST - 1 VIEW  COMPARISON:  Chest x-ray 04/11/2014.  FINDINGS: Lung volumes are low. Bibasilar opacities (right greater than left) may reflect areas of atelectasis and/or consolidation. No evidence of pulmonary edema. No definite pleural effusions. Heart size is normal. Upper mediastinal contours are within normal limits. Atherosclerosis in the thoracic aorta.  IMPRESSION: 1. Low lung volumes with bibasilar (right greater than left) opacities that are favored to represent atelectasis, although underlying airspace consolidation, particularly at the right base, is not excluded. Clinical correlation for signs and symptoms of infection or sequela of recent aspiration is recommended. 2. Atherosclerosis.   Electronically Signed   By: Vinnie Langton M.D.   On: 04/12/2014 06:22   Dg Chest  Portable 1 View  04/11/2014   CLINICAL DATA:  58 -year-old male with shortness of Breath. Initial encounter.  EXAM: PORTABLE CHEST - 1 VIEW  COMPARISON:  02/19/2014 and earlier.  FINDINGS: Portable AP semi upright view at 0837 hrs. Continued low lung volumes. Stable cardiac size and mediastinal contours.  No pneumothorax or pulmonary edema. Streaky opacity at both lung bases not significantly changed. No pleural effusion identified. Calcified atherosclerosis of the aorta again noted.  IMPRESSION: Stable low lung volumes.   Electronically Signed   By: Lars Pinks M.D.   On: 04/11/2014 08:52    CBC  Recent Labs Lab 04/11/14 0855 04/12/14 0501 04/13/14 0325  WBC 17.6* 18.6* 15.6*  HGB 14.1 11.0* 11.4*  HCT 43.4 35.8* 37.2*  PLT 299 230 253  MCV 100.7* 102.0* 102.8*  MCH 32.7 31.3 31.5  MCHC 32.5 30.7 30.6  RDW 15.0 15.3 15.1  LYMPHSABS 0.9  --   --   MONOABS 2.1*  --   --   EOSABS 0.0  --   --   BASOSABS 0.0  --   --     Chemistries   Recent Labs Lab 04/11/14 0855 04/12/14 0501 04/13/14 0325  NA 155* 156* 159*  K 4.0 2.7* 3.1*  CL 109 121* 123*  CO2 23 21 21   GLUCOSE 135* 84 101*  BUN 71* 55* 44*  CREATININE 2.77* 1.66* 1.26  CALCIUM 9.6 8.0* 8.7  MG  --   --  2.6*   ------------------------------------------------------------------------------------------------------------------ estimated creatinine clearance is 36.6 ml/min (by C-G formula based on Cr of 1.26). ------------------------------------------------------------------------------------------------------------------ No results found for this basename: HGBA1C,  in the last 72 hours ------------------------------------------------------------------------------------------------------------------ No results found for this basename: CHOL, HDL, LDLCALC, TRIG, CHOLHDL, LDLDIRECT,  in the last 72 hours ------------------------------------------------------------------------------------------------------------------ No results found for this basename: TSH, T4TOTAL, FREET3, T3FREE, THYROIDAB,  in the last 72 hours ------------------------------------------------------------------------------------------------------------------ No results found for this basename: VITAMINB12, FOLATE, FERRITIN, TIBC, IRON, RETICCTPCT,  in the  last 72 hours  Coagulation profile No results found for this basename: INR, PROTIME,  in the last 168 hours  No results found for this basename: DDIMER,  in the last 72 hours  Cardiac Enzymes  Recent Labs Lab 04/11/14 0855  TROPONINI <0.30   ------------------------------------------------------------------------------------------------------------------ No components found with this basename: POCBNP,      Time Spent in minutes  35   Thurnell Lose M.D on 04/16/2014 at 9:02 AM  Between 7am to 7pm - Pager - [phone]  After 7pm go to [email or web address] - password [email or web address]  And look for the night coverage person covering for me after hours   [facility] Office  [phone]   **Disclaimer: This note may have been dictated with voice recognition software. Similar sounding words can inadvertently be transcribed and this note may contain transcription errors which may not have been corrected upon publication of note.**

## 2014-04-17 LAB — CULTURE, BLOOD (ROUTINE X 2)
Culture: NO GROWTH
Culture: NO GROWTH

## 2014-04-17 NOTE — Progress Notes (Signed)
Patient Demographics  [name] [name], is a 80 y.o. male, DOB - 07-Jul-1923, [id number]  Admit date - 04/11/2014   Admitting Physician Rigoberto Noel, MD  Outpatient Primary MD for the patient is Walker Kehr, MD  LOS - 6   Chief Complaint  Patient presents with  . Shortness of Breath       Brief narrative:   80 y/o M with PMH of HTN, Colon Cancer (2005), Osteoarthritis, Dysarthria (unable to speak for last 5 years), Hypothyroidism, CVA, Frequent UTI's and aspiration who presented to [facility] ER on 5/19 with family reporting "fast breathing & less alert". Patient at baseline is non-verbal (many years) and non-ambulatory since 09/2013. His family are his primary caregivers. He was recently treated for a UTI and completed levaquin on 5/17. Family reported decreased PO intake over the prior two weeks and change in breathing noted am of presentation.   ER evaluation noted initial blood pressures to be normotensive but patient progressed to hypotension with systolic bp in the 65'H that responded to IVF, temp of 101.4, tachycardia into 140's, tachypnea requiring bipap. UA notable for 3-6 WBC's (post abx). Na 155, sr cr 2.77, glucose 135, troponin < 0.30, bnp 2283, lactic acid 4.2, wbc 17.6, MCV 100.7. PCCM consulted for admission. After initial aggressive care it was decided that patient will be best served with full comfort care, palliative care has been called and is now been transitioned to full comfort care with DO NOT RESUSCITATE.    Assessment & Plan    Patient with history of advanced Parkinson's disease, underlying dysphagia with recurrent aspiration, hypothyroidism, essential hypertension, failure to thrive, Atrial fibrillation. Initially admitted for sepsis due to aspiration pneumonia along with  dehydration and hypotension.   Initially treated with IV fluids, IV antibiotics along with oxygen and nebulizer treatments. Now after detailed discussion with family members which include son and wife patient is Transitioned to full comfort care under the guidance of palliative care. Goal now is comfort only. If he survived beyond the next few days we will look for inpatient hospice placement. All aggressive treatment including antibiotics, lab work etc. have been stopped. Goal of care strictly comfort. Most likely will require residential hospice placement discussed with wife and son bedside.      Code Status: DO NOT RESUSCITATE  Family Communication: Wife and son bedside on 04/14/2014, 04/15/2014  Disposition Plan: residential hospice   Procedures    Consults  palliative care   Medications  Scheduled Meds: . antiseptic oral rinse  15 mL Mouth Rinse q12n4p  . chlorhexidine  15 mL Mouth Rinse BID  . scopolamine  1 patch Transdermal Q72H   Continuous Infusions: . dextrose 10 mL/hr at 04/17/14 0347   PRN Meds:.sodium chloride, LORazepam, morphine injection  DVT Prophylaxis  SCD  Lab Results  Component Value Date   PLT 253 04/13/2014    Antibiotics     Anti-infectives   Start     Dose/Rate Route Frequency Ordered Stop   04/13/14 0600  vancomycin (VANCOCIN) IVPB 1000 mg/200 mL premix  Status:  Discontinued     1,000 mg 200 mL/hr over 60 Minutes Intravenous Every 24 hours 04/13/14 0517 04/13/14 1622   04/12/14 1000  piperacillin-tazobactam (ZOSYN) IVPB 3.375 g  Status:  Discontinued     3.375 g 12.5 mL/hr over 240 Minutes Intravenous Every 8 hours 04/12/14 0853 04/13/14 1622   04/11/14 1800  piperacillin-tazobactam (ZOSYN) IVPB 2.25 g  Status:  Discontinued     2.25 g 100 mL/hr over 30 Minutes Intravenous Every 8 hours 04/11/14 1308 04/12/14 0853   04/11/14 1100  vancomycin (VANCOCIN) IVPB 1000 mg/200 mL premix     1,000 mg 200 mL/hr over 60 Minutes Intravenous  Once  04/11/14 1009 04/11/14 1215   04/11/14 1000  piperacillin-tazobactam (ZOSYN) IVPB 3.375 g     3.375 g 100 mL/hr over 30 Minutes Intravenous  Once 04/11/14 [id number] 04/11/14 1030          Subjective:   [name] [name] today is in bed resting comfortably, does not appear to be in distress.  Objective:   Filed Vitals:   04/16/14 0613 04/16/14 1332 04/16/14 2117 04/17/14 0508  BP: 133/84 131/73 130/89 121/73  Pulse: 90 90 100 96  Temp: 98.6 F (37 C) 97.5 F (36.4 C) 98.1 F (36.7 C) 99.7 F (37.6 C)  TempSrc: Oral Oral Oral Axillary  Resp: 22 22 23 27   Height:      Weight:    71.442 kg (157 lb 8 oz)  SpO2: 91% 93% 94% 95%    Wt Readings from Last 3 Encounters:  04/17/14 71.442 kg (157 lb 8 oz)  03/02/14 71.668 kg (158 lb)  02/01/14 68.493 kg (151 lb)     Intake/Output Summary (Last 24 hours) at 04/17/14 1237 Last data filed at 04/17/14 1200  Gross per 24 hour  Intake      0 ml  Output      0 ml  Net      0 ml     Physical Exam  In bed in no discomfort resting comfortably Supple Neck,No JVD, No cervical lymphadenopathy appriciated.  Symmetrical Chest wall movement, Good air movement bilaterally, CLEAR bilateral breath sounds RRR,No Gallops,Rubs or new Murmurs, No Parasternal Heave +ve B.Sounds, Abd Soft, Non tender, No organomegaly appriciated, No rebound - guarding or rigidity. Wearing a condom catheter No Cyanosis, Clubbing or edema, No new Rash or bruise      Data Review   Micro Results Recent Results (from the past 240 hour(s))  CULTURE, BLOOD (ROUTINE X 2)     Status: None   Collection Time    04/11/14  8:55 AM      Result Value Ref Range Status   Specimen Description BLOOD RIGHT ANTECUBITAL   Final   Special Requests BOTTLES DRAWN AEROBIC AND ANAEROBIC 5CC   Final   Culture  Setup Time     Final   Value: 04/11/2014 14:36     Performed at [facility]   Culture     Final   Value: NO GROWTH 5 DAYS     Performed at [facility]   Report  Status 04/17/2014 FINAL   Final  URINE CULTURE     Status: None   Collection Time    04/11/14  8:59 AM      Result Value Ref Range Status   Specimen Description URINE, CATHETERIZED   Final   Special Requests NONE   Final   Culture  Setup Time     Final   Value: 04/11/2014 19:34     Performed at [place]     Final   Value: NO GROWTH     Performed at [facility]  Culture     Final   Value: NO GROWTH     Performed at [facility]   Report Status 04/12/2014 FINAL   Final  CULTURE, BLOOD (ROUTINE X 2)     Status: None   Collection Time    04/11/14  9:10 AM      Result Value Ref Range Status   Specimen Description BLOOD LEFT ANTECUBITAL   Final   Special Requests BOTTLES DRAWN AEROBIC AND ANAEROBIC 10CC   Final   Culture  Setup Time     Final   Value: 04/11/2014 14:36     Performed at [facility]   Culture     Final   Value: NO GROWTH 5 DAYS     Performed at [facility]   Report Status 04/17/2014 FINAL   Final  MRSA PCR SCREENING     Status: None   Collection Time    04/11/14  9:35 PM      Result Value Ref Range Status   MRSA by PCR NEGATIVE  NEGATIVE Final   Comment:            The GeneXpert MRSA Assay (FDA     approved for NASAL specimens     only), is one component of a     comprehensive MRSA colonization     surveillance program. It is not     intended to diagnose MRSA     infection nor to guide or     monitor treatment for     MRSA infections.    Radiology Reports Dg Chest Port 1 View  04/13/2014   CLINICAL DATA:  Cough, shortness of breath.  EXAM: PORTABLE CHEST - 1 VIEW  COMPARISON:  DG CHEST 1V PORT dated 04/12/2014  FINDINGS: The cardiac silhouette appears moderately enlarged, similar. Tortuous calcified aorta. Similar right pulmonary hilar airspace opacity with patchy right lower lobe airspace opacity, elevated right hemidiaphragm. Strandy densities in the lung bases bilaterally. Central pulmonary  vasculature congestion. No pleural effusions. No pneumothorax.  Soft tissue planes and included osseous structures are nonsuspicious, mild degenerative change of thoracic spine. Severe degenerative change of left shoulder. Multiple EKG lines overlie the patient and may obscure subtle underlying pathology.  IMPRESSION: Stable cardiomegaly, central pulmonary vasculature congestion with right perihilar and right lung base airspace opacities in a background of bibasilar atelectasis.   Electronically Signed   By: Elon Alas   On: 04/13/2014 06:29   Dg Chest Port 1 View  04/12/2014   CLINICAL DATA:  Shortness of breath and cough.  EXAM: PORTABLE CHEST - 1 VIEW  COMPARISON:  Chest x-ray 04/11/2014.  FINDINGS: Lung volumes are low. Bibasilar opacities (right greater than left) may reflect areas of atelectasis and/or consolidation. No evidence of pulmonary edema. No definite pleural effusions. Heart size is normal. Upper mediastinal contours are within normal limits. Atherosclerosis in the thoracic aorta.  IMPRESSION: 1. Low lung volumes with bibasilar (right greater than left) opacities that are favored to represent atelectasis, although underlying airspace consolidation, particularly at the right base, is not excluded. Clinical correlation for signs and symptoms of infection or sequela of recent aspiration is recommended. 2. Atherosclerosis.   Electronically Signed   By: Vinnie Langton M.D.   On: 04/12/2014 06:22   Dg Chest Portable 1 View  04/11/2014   CLINICAL DATA:  71 -year-old male with shortness of Breath. Initial encounter.  EXAM: PORTABLE CHEST - 1 VIEW  COMPARISON:  02/19/2014 and earlier.  FINDINGS: Portable AP  semi upright view at 0837 hrs. Continued low lung volumes. Stable cardiac size and mediastinal contours. No pneumothorax or pulmonary edema. Streaky opacity at both lung bases not significantly changed. No pleural effusion identified. Calcified atherosclerosis of the aorta again  noted.  IMPRESSION: Stable low lung volumes.   Electronically Signed   By: Lars Pinks M.D.   On: 04/11/2014 08:52    CBC  Recent Labs Lab 04/11/14 0855 04/12/14 0501 04/13/14 0325  WBC 17.6* 18.6* 15.6*  HGB 14.1 11.0* 11.4*  HCT 43.4 35.8* 37.2*  PLT 299 230 253  MCV 100.7* 102.0* 102.8*  MCH 32.7 31.3 31.5  MCHC 32.5 30.7 30.6  RDW 15.0 15.3 15.1  LYMPHSABS 0.9  --   --   MONOABS 2.1*  --   --   EOSABS 0.0  --   --   BASOSABS 0.0  --   --     Chemistries   Recent Labs Lab 04/11/14 0855 04/12/14 0501 04/13/14 0325  NA 155* 156* 159*  K 4.0 2.7* 3.1*  CL 109 121* 123*  CO2 23 21 21   GLUCOSE 135* 84 101*  BUN 71* 55* 44*  CREATININE 2.77* 1.66* 1.26  CALCIUM 9.6 8.0* 8.7  MG  --   --  2.6*   ------------------------------------------------------------------------------------------------------------------ estimated creatinine clearance is 36.6 ml/min (by C-G formula based on Cr of 1.26). ------------------------------------------------------------------------------------------------------------------ No results found for this basename: HGBA1C,  in the last 72 hours ------------------------------------------------------------------------------------------------------------------ No results found for this basename: CHOL, HDL, LDLCALC, TRIG, CHOLHDL, LDLDIRECT,  in the last 72 hours ------------------------------------------------------------------------------------------------------------------ No results found for this basename: TSH, T4TOTAL, FREET3, T3FREE, THYROIDAB,  in the last 72 hours ------------------------------------------------------------------------------------------------------------------ No results found for this basename: VITAMINB12, FOLATE, FERRITIN, TIBC, IRON, RETICCTPCT,  in the last 72 hours  Coagulation profile No results found for this basename: INR, PROTIME,  in the last 168 hours  No results found for this basename: DDIMER,  in the last 72  hours  Cardiac Enzymes  Recent Labs Lab 04/11/14 0855  TROPONINI <0.30   ------------------------------------------------------------------------------------------------------------------ No components found with this basename: POCBNP,      Time Spent in minutes  35   Thurnell Lose M.D on 04/17/2014 at 12:37 PM  Between 7am to 7pm - Pager - [phone]  After 7pm go to [email or web address] - password [email or web address]  And look for the night coverage person covering for me after hours   [facility] Office  [phone]   **Disclaimer: This note may have been dictated with voice recognition software. Similar sounding words can inadvertently be transcribed and this note may contain transcription errors which may not have been corrected upon publication of note.**

## 2014-04-17 NOTE — Progress Notes (Signed)
Progress Note from the Palliative Medicine Team at [facility]  Subjective:  -continued conversation with family regarding GOC and options, all are hoping for comfort, quality and dignity;   -discussed with nursing improtance of utilizing prn medications to maximize comfort  -continued discussion regarding natural trajectory and expectations at EOL, family verbalize surprise that the patient has not declined quicker knowing he is not taking anything by mouth for four days   Objective: No Known Allergies Scheduled Meds: . antiseptic oral rinse  15 mL Mouth Rinse q12n4p  . chlorhexidine  15 mL Mouth Rinse BID  . scopolamine  1 patch Transdermal Q72H   Continuous Infusions: . dextrose 10 mL/hr at 04/17/14 0347   PRN Meds:.sodium chloride, LORazepam, morphine injection  BP 121/73  Pulse 96  Temp(Src) 99.7 F (37.6 C) (Axillary)  Resp 27  Ht 5\' 6"  (1.676 m)  Wt 71.442 kg (157 lb 8 oz)  BMI 25.43 kg/m2  SpO2 95%   PPS:20 %     No intake or output data in the 24 hours ending 04/17/14 1222     Physical Exam:  General: ill appearing, NAD HEENT:  Dry buccal membranes, no exudate Chest:   Decreased in bases, scattered coarse BS,  Rate 27  CVS: tachycardic Abdomen: soft NT +BS Ext: without edema Neuro: lethargic, unable to follow commands  Labs: CBC    Component Value Date/Time   WBC 15.6* 04/13/2014 0325   WBC 8.8 11/01/2010 1407   RBC 3.62* 04/13/2014 0325   RBC 4.20 11/01/2010 1407   HGB 11.4* 04/13/2014 0325   HGB 14.0 11/01/2010 1407   HCT 37.2* 04/13/2014 0325   HCT 40.4 11/01/2010 1407   PLT 253 04/13/2014 0325   PLT 300 11/01/2010 1407   MCV 102.8* 04/13/2014 0325   MCV 96.1 11/01/2010 1407   MCH 31.5 04/13/2014 0325   MCH 33.4 11/01/2010 1407   MCHC 30.6 04/13/2014 0325   MCHC 34.8 11/01/2010 1407   RDW 15.1 04/13/2014 0325   RDW 13.5 11/01/2010 1407   LYMPHSABS 0.9 04/11/2014 0855   LYMPHSABS 2.4 11/01/2010 1407   MONOABS 2.1* 04/11/2014 0855   MONOABS 1.1* 11/01/2010  1407   EOSABS 0.0 04/11/2014 0855   EOSABS 0.2 11/01/2010 1407   BASOSABS 0.0 04/11/2014 0855   BASOSABS 0.0 11/01/2010 1407    BMET    Component Value Date/Time   NA 159* 04/13/2014 0325   K 3.1* 04/13/2014 0325   CL 123* 04/13/2014 0325   CO2 21 04/13/2014 0325   GLUCOSE 101* 04/13/2014 0325   BUN 44* 04/13/2014 0325   CREATININE 1.26 04/13/2014 0325   CALCIUM 8.7 04/13/2014 0325   GFRNONAA 49* 04/13/2014 0325   GFRAA 57* 04/13/2014 0325    CMP     Component Value Date/Time   NA 159* 04/13/2014 0325   K 3.1* 04/13/2014 0325   CL 123* 04/13/2014 0325   CO2 21 04/13/2014 0325   GLUCOSE 101* 04/13/2014 0325   BUN 44* 04/13/2014 0325   CREATININE 1.26 04/13/2014 0325   CALCIUM 8.7 04/13/2014 0325   PROT 7.1 02/01/2014 1152   ALBUMIN 3.8 02/01/2014 1152   AST 14 02/01/2014 1152   ALT 9 02/01/2014 1152   ALKPHOS 52 02/01/2014 1152   BILITOT 0.7 02/01/2014 1152   GFRNONAA 49* 04/13/2014 0325   GFRAA 57* 04/13/2014 0325     Assessment and Plan: 1. Code Status:  DNR/DNI-comfort 2. Symptom Control: Pain/Dyspnea: Morphine 1 mg IV every 1 hr prn Agitation: Ativan 1  mg IV every 4 hrs prn Dysphagia: Comfort sips and chips as tolerated with known risk of aspiration Terminal secretions: Scopolamine patch 3. Psycho/Social: Emotional support offered to family at bedside, all understand the overall poor prognosis and are comfortable with comfort as the main focus of care 4.  Disposition: Long conversation with family regarding likely prognosis of days, are open to residential hospice if disposition becomes necessary.   Family continues to struggle with  need for any transition form the hospital.   Patient Documents Completed or Given: Document Given Completed  Advanced Directives Pkt    MOST yes   DNR    Gone from My Sight    Hard Choices yes     Time In Time Out Total Time Spent with Patient Total Overall Time  0815 0850 35 min 35 min    Greater than 50%  of this time was spent counseling and  coordinating care related to the above assessment and plan.  Wadie Lessen NP  Palliative Medicine Team Team Phone # [phone] Pager [phone]   1

## 2014-04-17 NOTE — Consult Note (Signed)
[facility] Liaison: Received requests from [place] 04/14/2014 and CSW Mel Almond 04/16/2014 for family interest in [facility]. No availability at time of referral Friday. Denyse Amass was aware. Chart was reviewed. [facility] has been aware of Mr. Plog since 04/14/2014. Will follow up with CSW in am. Thank you. Erling Conte LCSW [phone]

## 2014-04-18 MED ORDER — LORAZEPAM 2 MG/ML PO CONC: 1.0000 mg | Freq: Four times a day (QID) | ORAL | Status: AC | PRN

## 2014-04-18 MED ORDER — SCOPOLAMINE 1 MG/3DAYS TD PT72: 1.0000 | MEDICATED_PATCH | TRANSDERMAL | Status: AC

## 2014-04-18 MED ORDER — BIOTENE DRY MOUTH MT LIQD: 15.0000 mL | Freq: Two times a day (BID) | OROMUCOSAL | Status: AC

## 2014-04-18 MED ORDER — MORPHINE SULFATE (CONCENTRATE) 10 MG /0.5 ML PO SOLN: 10.0000 mg | ORAL | Status: AC | PRN

## 2014-04-18 NOTE — Clinical Social Work Note (Signed)
Paperwork completed for transfer to [facility]- family is pleased with this option as it will be a convenient and comfortable setting for patient and wife. EMS to transport-  Eduard Clos, MSW, [place]

## 2014-04-18 NOTE — Clinical Social Work Note (Signed)
[facility] has confirmed bed for patient for today- family is quite pleased and appreciative. MD aware and will plan transfer via EMS later today.  Eduard Clos, MSW, [place]

## 2014-04-18 NOTE — Discharge Instructions (Signed)
Goal of care is comfort care, activity as tolerated with assistance and fall precautions.  Comfort feeds ice chips only with full feeding assistance and aspiration precautions. He is extremely high risk for aspirating.

## 2014-04-18 NOTE — Progress Notes (Signed)
Pt discharge to residentiial hospice at [facility]. Report call to receiving facility and spoke with [facility]  the nurse.

## 2014-04-18 NOTE — Discharge Summary (Signed)
[name] [name], is a 80 y.o. male  DOB 1923-05-27  MRN [id number].  Admission date:  04/11/2014  Admitting Physician  Rigoberto Noel, MD  Discharge Date:  04/18/2014   Primary MD  Walker Kehr, MD  Recommendations for primary care physician for things to follow:   Goal of care is comfort only he is hospice   Admission Diagnosis  Septic shock(785.52) [785.52] Dehydration [276.51] ARF (acute renal failure) [584.9] Encephalopathy [348.30] SIRS (systemic inflammatory response syndrome) [995.90] Atrial fibrillation with rapid ventricular response [427.31]   Discharge Diagnosis  Septic shock(785.52) [785.52] Dehydration [276.51] ARF (acute renal failure) [584.9] Encephalopathy [348.30] SIRS (systemic inflammatory response syndrome) [995.90] Atrial fibrillation with rapid ventricular response [427.31]    Active Problems:   HYPOTHYROIDISM   HYPERTENSION   Parkinsonian syndrome   Hypotension   Dysphagia, unspecified(787.20)   Dehydration with hypernatremia   Pulmonary infiltrate in right lung on CXR   FTT (failure to thrive) in adult   Atrial fibrillation with RVR   Dyspnea      Past Medical History  Diagnosis Date  . Hypertension   . Cancer     colon cancer, hx of 2005  . Osteoarthritis   . Dysarthria   . Hypothyroidism   . History of CVA (cerebrovascular accident)   . Tremor   . History of skin cancer 2009  . UTI (urinary tract infection) 2009, 2011, 2015  . Psoriasis     Past Surgical History  Procedure Laterality Date  . Colectomy       Discharge Condition: Guarded, hospice   Follow UP      Discharge Instructions  and  Discharge Medications      Discharge Instructions   Discharge instructions    Complete by:  As directed   Goal of care is comfort care, activity as tolerated with assistance  and fall precautions.  Comfort feeds ice chips only with full feeding assistance and aspiration precautions. He is extremely high risk for aspirating.  Goal of care is comfort care, activity as tolerated with assistance and fall precautions.  Comfort feeds ice chips only with full feeding assistance and aspiration precautions. He is extremely high risk for aspirating.            Medication List    STOP taking these medications       acetaminophen 500 MG tablet  Commonly known as:  TYLENOL     aspirin 81 MG EC tablet     barrier cream Crea  Commonly known as:  non-specified     levothyroxine 100 MCG tablet  Commonly known as:  SYNTHROID, LEVOTHROID     Vitamin D3 1000 UNITS Caps      TAKE these medications       antiseptic oral rinse Liqd  15 mLs by Mouth Rinse route 2 times daily at 12 noon and 4 pm.     LORazepam 2 MG/ML concentrated solution  Commonly known as:  ATIVAN  Take 0.5 mLs (1 mg total) by mouth every 6 (six)  hours as needed for anxiety.     morphine CONCENTRATE 10 mg / 0.5 ml concentrated solution  Take 0.5 mLs (10 mg total) by mouth every 3 (three) hours as needed for moderate pain or severe pain.     scopolamine 1 MG/3DAYS  Commonly known as:  TRANSDERM-SCOP  Place 1 patch (1.5 mg total) onto the skin every 3 (three) days.     tamsulosin 0.4 MG Caps capsule  Commonly known as:  FLOMAX  Take 1 capsule (0.4 mg total) by mouth daily.          Diet and Activity recommendation: See Discharge Instructions above   Consults obtained - palliative care   Major procedures and Radiology Reports - PLEASE review detailed and final reports for all details, in brief -       Dg Chest Port 1 View  04/13/2014   CLINICAL DATA:  Cough, shortness of breath.  EXAM: PORTABLE CHEST - 1 VIEW  COMPARISON:  DG CHEST 1V PORT dated 04/12/2014  FINDINGS: The cardiac silhouette appears moderately enlarged, similar. Tortuous calcified aorta. Similar right pulmonary hilar  airspace opacity with patchy right lower lobe airspace opacity, elevated right hemidiaphragm. Strandy densities in the lung bases bilaterally. Central pulmonary vasculature congestion. No pleural effusions. No pneumothorax.  Soft tissue planes and included osseous structures are nonsuspicious, mild degenerative change of thoracic spine. Severe degenerative change of left shoulder. Multiple EKG lines overlie the patient and may obscure subtle underlying pathology.  IMPRESSION: Stable cardiomegaly, central pulmonary vasculature congestion with right perihilar and right lung base airspace opacities in a background of bibasilar atelectasis.   Electronically Signed   By: Elon Alas   On: 04/13/2014 06:29   Dg Chest Port 1 View  04/12/2014   CLINICAL DATA:  Shortness of breath and cough.  EXAM: PORTABLE CHEST - 1 VIEW  COMPARISON:  Chest x-ray 04/11/2014.  FINDINGS: Lung volumes are low. Bibasilar opacities (right greater than left) may reflect areas of atelectasis and/or consolidation. No evidence of pulmonary edema. No definite pleural effusions. Heart size is normal. Upper mediastinal contours are within normal limits. Atherosclerosis in the thoracic aorta.  IMPRESSION: 1. Low lung volumes with bibasilar (right greater than left) opacities that are favored to represent atelectasis, although underlying airspace consolidation, particularly at the right base, is not excluded. Clinical correlation for signs and symptoms of infection or sequela of recent aspiration is recommended. 2. Atherosclerosis.   Electronically Signed   By: Vinnie Langton M.D.   On: 04/12/2014 06:22   Dg Chest Portable 1 View  04/11/2014   CLINICAL DATA:  16 -year-old male with shortness of Breath. Initial encounter.  EXAM: PORTABLE CHEST - 1 VIEW  COMPARISON:  02/19/2014 and earlier.  FINDINGS: Portable AP semi upright view at 0837 hrs. Continued low lung volumes. Stable cardiac size and mediastinal contours. No pneumothorax  or pulmonary edema. Streaky opacity at both lung bases not significantly changed. No pleural effusion identified. Calcified atherosclerosis of the aorta again noted.  IMPRESSION: Stable low lung volumes.   Electronically Signed   By: Lars Pinks M.D.   On: 04/11/2014 08:52    Micro Results      Recent Results (from the past 240 hour(s))  CULTURE, BLOOD (ROUTINE X 2)     Status: None   Collection Time    04/11/14  8:55 AM      Result Value Ref Range Status   Specimen Description BLOOD RIGHT ANTECUBITAL   Final   Special Requests BOTTLES DRAWN  AEROBIC AND ANAEROBIC 5CC   Final   Culture  Setup Time     Final   Value: 04/11/2014 14:36     Performed at [facility]   Culture     Final   Value: NO GROWTH 5 DAYS     Performed at [facility]   Report Status 04/17/2014 FINAL   Final  URINE CULTURE     Status: None   Collection Time    04/11/14  8:59 AM      Result Value Ref Range Status   Specimen Description URINE, CATHETERIZED   Final   Special Requests NONE   Final   Culture  Setup Time     Final   Value: 04/11/2014 19:34     Performed at [place]     Final   Value: NO GROWTH     Performed at [facility]   Culture     Final   Value: NO GROWTH     Performed at [facility]   Report Status 04/12/2014 FINAL   Final  CULTURE, BLOOD (ROUTINE X 2)     Status: None   Collection Time    04/11/14  9:10 AM      Result Value Ref Range Status   Specimen Description BLOOD LEFT ANTECUBITAL   Final   Special Requests BOTTLES DRAWN AEROBIC AND ANAEROBIC 10CC   Final   Culture  Setup Time     Final   Value: 04/11/2014 14:36     Performed at [facility]   Culture     Final   Value: NO GROWTH 5 DAYS     Performed at [facility]   Report Status 04/17/2014 FINAL   Final  MRSA PCR SCREENING     Status: None   Collection Time    04/11/14  9:35 PM      Result Value Ref Range Status   MRSA by PCR NEGATIVE  NEGATIVE  Final   Comment:            The GeneXpert MRSA Assay (FDA     approved for NASAL specimens     only), is one component of a     comprehensive MRSA colonization     surveillance program. It is not     intended to diagnose MRSA     infection nor to guide or     monitor treatment for     MRSA infections.     History of present illness and  Hospital Course:     Kindly see H&P for history of present illness and admission details, please review complete Labs, Consult reports and Test reports for all details in brief UZIAH SORTER, is a 80 y.o. male, patient with history of of HTN, Colon Cancer (2005), Osteoarthritis, Dysarthria (unable to speak for last 5 years), Hypothyroidism, CVA, Frequent UTI's and aspiration who presented to [facility] ER on 5/19 with family reporting "fast breathing & less alert". Patient at baseline is non-verbal (many years) and non-ambulatory since 09/2013. His family are his primary caregivers. He was recently treated for a UTI and completed levaquin on 5/17. Family reported decreased PO intake over the prior two weeks and change in breathing noted am of presentation.    ER evaluation noted initial blood pressures to be normotensive but patient progressed to hypotension with systolic bp in the 02'V that responded to IVF, temp of 101.4, tachycardia into 140's, tachypnea requiring bipap.  UA notable for 3-6 WBC's (post abx). Na 155, sr cr 2.77, glucose 135, troponin < 0.30, bnp 2283, lactic acid 4.2, wbc 17.6, MCV 100.7. PCCM consulted for admission.       Patient with history of advanced Parkinson's disease, underlying dysphagia with recurrent aspiration, hypothyroidism, essential hypertension, failure to thrive, Atrial fibrillation. Initially admitted for sepsis due to aspiration pneumonia along with dehydration and hypotension.    Initially treated with IV fluids, IV antibiotics along with oxygen and nebulizer treatments. Now after detailed discussion with family members which  include son and wife patient is Transitioned to full comfort care under the guidance of palliative care. Goal now is comfort only. If he survived beyond the next few days we will look for inpatient hospice placement. All aggressive treatment including antibiotics, lab work etc. have been stopped. Goal of care strictly comfort. Will need residential hospice placement discussed with wife and son bedside.      Today   Subjective:   Theodoro Brecker today remains nonverbal but comfortable in bed.  Objective:   Blood pressure 116/76, pulse 100, temperature 98.7 F (37.1 C), temperature source Oral, resp. rate 28, height 5\' 6"  (1.676 m), weight 68.312 kg (150 lb 9.6 oz), SpO2 93.00%.   Intake/Output Summary (Last 24 hours) at 04/18/14 1017 Last data filed at 04/17/14 1200  Gross per 24 hour  Intake      0 ml  Output      0 ml  Net      0 ml    Exam Unresponsive but appears comfortable [place].AT,PERRAL Supple Neck,No JVD, No cervical lymphadenopathy appriciated.  Symmetrical Chest wall movement, Good air movement bilaterally, coarse breath sounds bilaterally RRR,No Gallops,Rubs or new Murmurs, No Parasternal Heave +ve B.Sounds, Abd Soft, Non tender, No organomegaly appriciated, No rebound -guarding or rigidity. No Cyanosis, Clubbing or edema, No new Rash or bruise  Data Review   CBC w Diff: Lab Results  Component Value Date   WBC 15.6* 04/13/2014   WBC 8.8 11/01/2010   HGB 11.4* 04/13/2014   HGB 14.0 11/01/2010   HCT 37.2* 04/13/2014   HCT 40.4 11/01/2010   PLT 253 04/13/2014   PLT 300 11/01/2010   LYMPHOPCT 5* 04/11/2014   LYMPHOPCT 27.3 11/01/2010   MONOPCT 12 04/11/2014   MONOPCT 12.0 11/01/2010   EOSPCT 0 04/11/2014   EOSPCT 1.8 11/01/2010   BASOPCT 0 04/11/2014   BASOPCT 0.4 11/01/2010    CMP: Lab Results  Component Value Date   NA 159* 04/13/2014   K 3.1* 04/13/2014   CL 123* 04/13/2014   CO2 21 04/13/2014   BUN 44* 04/13/2014   CREATININE 1.26 04/13/2014   PROT 7.1 02/01/2014    ALBUMIN 3.8 02/01/2014   BILITOT 0.7 02/01/2014   ALKPHOS 52 02/01/2014   AST 14 02/01/2014   ALT 9 02/01/2014  .   Total Time in preparing paper work, data evaluation and todays exam - 35 minutes  Thurnell Lose M.D on 04/18/2014 at 10:17 AM  [facility] Office  [phone]   **Disclaimer: This note may have been dictated with voice recognition software. Similar sounding words can inadvertently be transcribed and this note may contain transcription errors which may not have been corrected upon publication of note.**

## 2014-05-02 ENCOUNTER — Telehealth: Payer: Self-pay

## 2014-05-02 NOTE — Telephone Encounter (Signed)
Patient past away @ [facility] per [employer]

## 2014-05-05 ENCOUNTER — Ambulatory Visit: Payer: Medicare Other | Admitting: Internal Medicine

## 2014-05-15 NOTE — Consult Note (Signed)
I have reviewed and discussed the care of this patient in detail with the nurse practitioner including pertinent patient records, physical exam findings and data. I agree with details of this encounter.  

## 2014-05-24 DEATH — deceased
